# Patient Record
Sex: Female | Born: 1975 | Race: White | Hispanic: No | Marital: Married | Smoking: Former smoker
Health system: Southern US, Community
[De-identification: ages and names within clinical notes are randomized; demographics above are authoritative.]

## PROBLEM LIST (undated history)

## (undated) DIAGNOSIS — R7303 Prediabetes: Secondary | ICD-10-CM

## (undated) DIAGNOSIS — E119 Type 2 diabetes mellitus without complications: Secondary | ICD-10-CM

## (undated) DIAGNOSIS — S0990XA Unspecified injury of head, initial encounter: Secondary | ICD-10-CM

## (undated) DIAGNOSIS — E221 Hyperprolactinemia: Secondary | ICD-10-CM

## (undated) DIAGNOSIS — D497 Neoplasm of unspecified behavior of endocrine glands and other parts of nervous system: Secondary | ICD-10-CM

## (undated) HISTORY — DX: Unspecified injury of head, initial encounter: S09.90XA

## (undated) HISTORY — DX: Hyperprolactinemia: E22.1

## (undated) HISTORY — DX: Prediabetes: R73.03

---

## 2001-05-26 ENCOUNTER — Other Ambulatory Visit: Admission: RE | Admit: 2001-05-26 | Discharge: 2001-05-26 | Payer: Self-pay | Admitting: Emergency Medicine

## 2002-07-06 ENCOUNTER — Other Ambulatory Visit: Admission: RE | Admit: 2002-07-06 | Discharge: 2002-07-06 | Payer: Self-pay | Admitting: Family Medicine

## 2003-09-13 ENCOUNTER — Other Ambulatory Visit: Admission: RE | Admit: 2003-09-13 | Discharge: 2003-09-13 | Payer: Self-pay | Admitting: Family Medicine

## 2004-09-25 ENCOUNTER — Other Ambulatory Visit: Admission: RE | Admit: 2004-09-25 | Discharge: 2004-09-25 | Payer: Self-pay | Admitting: Family Medicine

## 2005-10-17 ENCOUNTER — Other Ambulatory Visit: Admission: RE | Admit: 2005-10-17 | Discharge: 2005-10-17 | Payer: Self-pay | Admitting: Family Medicine

## 2006-02-04 ENCOUNTER — Other Ambulatory Visit: Admission: RE | Admit: 2006-02-04 | Discharge: 2006-02-04 | Payer: Self-pay | Admitting: Obstetrics and Gynecology

## 2006-08-06 ENCOUNTER — Inpatient Hospital Stay (HOSPITAL_COMMUNITY): Admission: AD | Admit: 2006-08-06 | Discharge: 2006-08-09 | Payer: Self-pay | Admitting: Obstetrics and Gynecology

## 2007-01-22 ENCOUNTER — Other Ambulatory Visit: Admission: RE | Admit: 2007-01-22 | Discharge: 2007-01-22 | Payer: Self-pay | Admitting: Obstetrics and Gynecology

## 2008-01-27 ENCOUNTER — Other Ambulatory Visit: Admission: RE | Admit: 2008-01-27 | Discharge: 2008-01-27 | Payer: Self-pay | Admitting: Obstetrics and Gynecology

## 2008-03-18 ENCOUNTER — Encounter: Admission: RE | Admit: 2008-03-18 | Discharge: 2008-03-18 | Payer: Self-pay | Admitting: Family Medicine

## 2008-03-29 ENCOUNTER — Encounter: Admission: RE | Admit: 2008-03-29 | Discharge: 2008-06-27 | Payer: Self-pay | Admitting: Family Medicine

## 2008-09-18 ENCOUNTER — Encounter: Admission: RE | Admit: 2008-09-18 | Discharge: 2008-09-18 | Payer: Self-pay | Admitting: Internal Medicine

## 2009-05-30 ENCOUNTER — Other Ambulatory Visit: Admission: RE | Admit: 2009-05-30 | Discharge: 2009-05-30 | Payer: Self-pay | Admitting: Obstetrics and Gynecology

## 2009-08-09 ENCOUNTER — Ambulatory Visit (HOSPITAL_COMMUNITY): Admission: RE | Admit: 2009-08-09 | Discharge: 2009-08-09 | Payer: Self-pay | Admitting: Obstetrics and Gynecology

## 2009-09-06 ENCOUNTER — Ambulatory Visit (HOSPITAL_COMMUNITY): Admission: RE | Admit: 2009-09-06 | Discharge: 2009-09-06 | Payer: Self-pay | Admitting: Obstetrics and Gynecology

## 2009-11-29 ENCOUNTER — Encounter (INDEPENDENT_AMBULATORY_CARE_PROVIDER_SITE_OTHER): Payer: Self-pay | Admitting: Obstetrics and Gynecology

## 2009-11-29 ENCOUNTER — Inpatient Hospital Stay (HOSPITAL_COMMUNITY): Admission: AD | Admit: 2009-11-29 | Discharge: 2009-12-01 | Payer: Self-pay | Admitting: Obstetrics and Gynecology

## 2010-04-13 ENCOUNTER — Encounter: Admission: RE | Admit: 2010-04-13 | Discharge: 2010-04-13 | Payer: Self-pay | Admitting: Internal Medicine

## 2010-05-22 ENCOUNTER — Other Ambulatory Visit: Admission: RE | Admit: 2010-05-22 | Discharge: 2010-05-22 | Payer: Self-pay | Admitting: Obstetrics and Gynecology

## 2010-05-24 ENCOUNTER — Encounter: Admission: RE | Admit: 2010-05-24 | Discharge: 2010-05-24 | Payer: Self-pay | Admitting: Obstetrics and Gynecology

## 2010-12-02 ENCOUNTER — Encounter: Payer: Self-pay | Admitting: Internal Medicine

## 2010-12-03 ENCOUNTER — Encounter: Payer: Self-pay | Admitting: Obstetrics and Gynecology

## 2010-12-03 ENCOUNTER — Encounter: Payer: Self-pay | Admitting: Internal Medicine

## 2011-01-27 LAB — URINALYSIS, ROUTINE W REFLEX MICROSCOPIC
Bilirubin Urine: NEGATIVE
Ketones, ur: NEGATIVE mg/dL
Protein, ur: NEGATIVE mg/dL
pH: 7.5 (ref 5.0–8.0)

## 2011-01-27 LAB — CBC
HCT: 29.7 % — ABNORMAL LOW (ref 36.0–46.0)
HCT: 36.3 % (ref 36.0–46.0)
Hemoglobin: 9.7 g/dL — ABNORMAL LOW (ref 12.0–15.0)
MCV: 82.3 fL (ref 78.0–100.0)
Platelets: 168 10*3/uL (ref 150–400)
RBC: 4.41 MIL/uL (ref 3.87–5.11)

## 2011-03-29 NOTE — Discharge Summary (Signed)
Cynthia [name], Cynthia [name]           ACCOUNT NO.:  [id number]   MEDICAL RECORD NO.:  [id number]          PATIENT TYPE:  INP   LOCATION:  9125                          FACILITY:  [facility]   PHYSICIAN:  Gerald Leitz, MD          DATE OF BIRTH:  23-Oct-1976   DATE OF ADMISSION:  08/06/2006  DATE OF DISCHARGE:  08/09/2006                                 DISCHARGE SUMMARY   INDICATION FOR ADMISSION:  Term intrauterine pregnancy, with history of  cesarean section, in labor.   DISCHARGE DIAGNOSIS:  Status post repeat cesarean section.   BRIEF HOSPITALIZATION COURSE:  Patient admitted on August 06, 2006 with  onset of labor.  Had a history of cesarean section and desired repeat.  Cesarean section was performed without complication.  The patient tolerated  the operation well.  Had a post-cesarean section hematocrit of 9.4.  She was  started on iron sulfate for anemia.  She was discharged home on August 09, 2006, doing well and in stable condition.   DISCHARGE MEDICATIONS:  1. Percocet.  2. Motrin.   She will follow up with Dr. Richardson Dopp in 10-14 days.   ACTIVITY:  Ad lib.  Pelvic rest x6 weeks.  No driving for 2 weeks.      Gerald Leitz, MD  Electronically Signed     TC/MEDQ  D:  09/05/2006  T:  09/08/2006  Job:  [id number]

## 2011-03-29 NOTE — Op Note (Signed)
NAMELAURIN, Cynthia           ACCOUNT NO.:  [id number]   MEDICAL RECORD NO.:  [id number]          PATIENT TYPE:  INP   LOCATION:  9125                          [facility]   PHYSICIAN:  Gerald Leitz, MD          DATE OF BIRTH:  1976-01-19   DATE OF PROCEDURE:  08/06/2006  DATE OF DISCHARGE:                                 OPERATIVE REPORT   PREOPERATIVE DIAGNOSIS:  A 37-2/7 week intrauterine pregnancy with history  of cesarean section and labor.  Desires repeat cesarean.   POSTOPERATIVE DIAGNOSIS:  A 37-2/7 week intrauterine pregnancy with history  of cesarean section and labor.  Desires repeat cesarean.   PROCEDURE:  Repeat low transverse cesarean section.   SURGEON:  Gerald Leitz, MD   ASSISTANT:  Bing Neighbors. Delcambre, MD   ANESTHESIA:  Spinal.   SPECIMENS:  Placenta specimen sent to pathology.   ESTIMATED BLOOD LOSS:  800 cc.   FLUIDS:  Lactated Ringer's 3500 cc.   URINE OUTPUT:  Clear urine, 200 cc, at the end of the procedure.   COMPLICATIONS:  None.   INDICATIONS:  This is a 35 year old G2, P1, 0-0-1, at 37-2/7 weeks  intrauterine pregnancy with history of cesarean section, who presented to  the maternity admissions unit in labor.  Maximum dilatation of 4 cm.  She  desires repeat cesarean section.  Risks, benefits and alternatives were  discussed with the patient.  She was taken to the operating room.   PROCEDURE:  She was prepped and draped in the usual sterile fashion.  A  Pfannenstiel skin incision was made with the scalpel and carried down to the  underlying layer of fascia.  The fascia was incised in the midline.  The  incision was extended laterally with Mayo scissors.  The anterior aspect of  the fascia was elevated, and the underlying rectus muscles were dissected  off using Mayo scissors.  This was repeated on the anterior aspect of the  fascial incision.  The rectus muscles were separated in the midline.  The  peritoneum was identified, tented up, and  entered sharply with Metzenbaum  scissors.  The incision was extended superiorly and inferiorly with  visualization of the bladder.  A bladder blade was then inserted.  The  vesicouterine peritoneum was identified, tented up, entered sharply with  Metzenbaum scissors.  The incision was extended laterally with Metzenbaum  scissors.  The bladder flap was created digitally.  The bladder blade was  then reinserted.  A low transverse incision was made with the scalpel.  The  incision was extended laterally with bandage scissors and razor ruptured.  The infant's head was delivered atraumatically.  Nuchal cord x1 was reduced.  The mouth and nose were bulb-suctioned. The anterior shoulder and body were  delivered.  The cord was clamped x2.  The infant was then handed off to the  waiting neonatologist.  The placenta was removed manually.  The uterus  exteriorized, cleared of all clots and debris.  The uterine incision was  repaired with 0 Vicryl in a running locked fashion.  A second layer of the  same suture was used with excellent hemostasis noted.  The uterus was  returned to the abdomen.  The abdomen was then copiously irrigated.  The  peritoneum was reapproximated with 2-0 Vicryl.  The fascia was  reapproximated with 0 PDS.  The skin was reapproximated with oral Vicodin  with a Mellody Dance needle.  Sponge, lap, and needle counts were correct x2.  Ancef  2 gm were given at cord clamp.  The patient was taken to the recovery room  awake in a stable condition.      Gerald Leitz, MD  Electronically Signed     TC/MEDQ  D:  08/06/2006  T:  08/08/2006  Job:  [id number]

## 2011-05-01 ENCOUNTER — Other Ambulatory Visit: Payer: Self-pay | Admitting: Internal Medicine

## 2011-05-01 DIAGNOSIS — D353 Benign neoplasm of craniopharyngeal duct: Secondary | ICD-10-CM

## 2011-05-28 ENCOUNTER — Other Ambulatory Visit (HOSPITAL_COMMUNITY)
Admission: RE | Admit: 2011-05-28 | Discharge: 2011-05-28 | Disposition: A | Discharge status: Home / Self Care | Payer: BC Managed Care – PPO | Source: Ambulatory Visit | Attending: Obstetrics and Gynecology | Admitting: Obstetrics and Gynecology

## 2011-05-28 ENCOUNTER — Other Ambulatory Visit: Payer: Self-pay | Admitting: Obstetrics and Gynecology

## 2011-05-28 DIAGNOSIS — Z01419 Encounter for gynecological examination (general) (routine) without abnormal findings: Secondary | ICD-10-CM | POA: Insufficient documentation

## 2011-06-15 ENCOUNTER — Ambulatory Visit
Admission: RE | Admit: 2011-06-15 | Discharge: 2011-06-15 | Disposition: A | Discharge status: Home / Self Care | Payer: BC Managed Care – PPO | Source: Ambulatory Visit | Attending: Internal Medicine | Admitting: Internal Medicine

## 2011-06-15 DIAGNOSIS — D353 Benign neoplasm of craniopharyngeal duct: Secondary | ICD-10-CM

## 2011-06-15 MED ORDER — GADOBENATE DIMEGLUMINE 529 MG/ML IV SOLN
11.0000 mL | Freq: Once | INTRAVENOUS | Status: AC | PRN
  Administered 2011-06-15: 11 mL via INTRAVENOUS

## 2012-06-01 ENCOUNTER — Other Ambulatory Visit (HOSPITAL_COMMUNITY)
Admission: RE | Admit: 2012-06-01 | Discharge: 2012-06-01 | Disposition: A | Discharge status: Home / Self Care | Payer: BC Managed Care – PPO | Source: Ambulatory Visit | Attending: Obstetrics and Gynecology | Admitting: Obstetrics and Gynecology

## 2012-06-01 ENCOUNTER — Other Ambulatory Visit: Payer: Self-pay | Admitting: Obstetrics and Gynecology

## 2012-06-01 DIAGNOSIS — Z01419 Encounter for gynecological examination (general) (routine) without abnormal findings: Secondary | ICD-10-CM | POA: Insufficient documentation

## 2013-06-16 ENCOUNTER — Other Ambulatory Visit: Payer: Self-pay | Admitting: Obstetrics and Gynecology

## 2013-06-16 ENCOUNTER — Other Ambulatory Visit (HOSPITAL_COMMUNITY)
Admission: RE | Admit: 2013-06-16 | Discharge: 2013-06-16 | Disposition: A | Discharge status: Home / Self Care | Payer: BC Managed Care – PPO | Source: Ambulatory Visit | Attending: Obstetrics and Gynecology | Admitting: Obstetrics and Gynecology

## 2013-06-16 DIAGNOSIS — Z1151 Encounter for screening for human papillomavirus (HPV): Secondary | ICD-10-CM | POA: Insufficient documentation

## 2013-06-16 DIAGNOSIS — Z01419 Encounter for gynecological examination (general) (routine) without abnormal findings: Secondary | ICD-10-CM | POA: Insufficient documentation

## 2014-06-06 ENCOUNTER — Other Ambulatory Visit: Payer: Self-pay | Admitting: Internal Medicine

## 2014-06-06 DIAGNOSIS — D352 Benign neoplasm of pituitary gland: Secondary | ICD-10-CM

## 2014-06-15 ENCOUNTER — Ambulatory Visit
Admission: RE | Admit: 2014-06-15 | Discharge: 2014-06-15 | Disposition: A | Discharge status: Home / Self Care | Payer: BC Managed Care – PPO | Source: Ambulatory Visit | Attending: Internal Medicine | Admitting: Internal Medicine

## 2014-06-15 DIAGNOSIS — D352 Benign neoplasm of pituitary gland: Secondary | ICD-10-CM

## 2014-06-15 MED ORDER — GADOBENATE DIMEGLUMINE 529 MG/ML IV SOLN
10.0000 mL | Freq: Once | INTRAVENOUS | Status: AC | PRN
  Administered 2014-06-15: 10 mL via INTRAVENOUS

## 2014-06-20 ENCOUNTER — Other Ambulatory Visit (HOSPITAL_COMMUNITY)
Admission: RE | Admit: 2014-06-20 | Discharge: 2014-06-20 | Disposition: A | Discharge status: Home / Self Care | Payer: BC Managed Care – PPO | Source: Ambulatory Visit | Attending: Obstetrics and Gynecology | Admitting: Obstetrics and Gynecology

## 2014-06-20 ENCOUNTER — Other Ambulatory Visit: Payer: Self-pay | Admitting: Obstetrics and Gynecology

## 2014-06-20 DIAGNOSIS — Z01419 Encounter for gynecological examination (general) (routine) without abnormal findings: Secondary | ICD-10-CM | POA: Diagnosis not present

## 2014-06-21 LAB — CYTOLOGY - PAP

## 2015-07-12 ENCOUNTER — Other Ambulatory Visit: Payer: Self-pay | Admitting: Obstetrics and Gynecology

## 2015-07-12 ENCOUNTER — Other Ambulatory Visit (HOSPITAL_COMMUNITY)
Admission: RE | Admit: 2015-07-12 | Discharge: 2015-07-12 | Disposition: A | Discharge status: Home / Self Care | Payer: BLUE CROSS/BLUE SHIELD | Source: Ambulatory Visit | Attending: Obstetrics and Gynecology | Admitting: Obstetrics and Gynecology

## 2015-07-12 DIAGNOSIS — Z01419 Encounter for gynecological examination (general) (routine) without abnormal findings: Secondary | ICD-10-CM | POA: Diagnosis not present

## 2015-07-13 LAB — CYTOLOGY - PAP

## 2015-07-20 ENCOUNTER — Other Ambulatory Visit: Payer: Self-pay | Admitting: Family Medicine

## 2015-07-20 DIAGNOSIS — R1011 Right upper quadrant pain: Secondary | ICD-10-CM

## 2015-07-24 ENCOUNTER — Other Ambulatory Visit: Payer: BLUE CROSS/BLUE SHIELD

## 2015-07-26 ENCOUNTER — Ambulatory Visit
Admission: RE | Admit: 2015-07-26 | Discharge: 2015-07-26 | Disposition: A | Discharge status: Home / Self Care | Payer: BLUE CROSS/BLUE SHIELD | Source: Ambulatory Visit | Attending: Family Medicine | Admitting: Family Medicine

## 2015-07-26 DIAGNOSIS — R1011 Right upper quadrant pain: Secondary | ICD-10-CM

## 2016-06-28 ENCOUNTER — Other Ambulatory Visit: Payer: Self-pay | Admitting: Internal Medicine

## 2016-06-28 DIAGNOSIS — D352 Benign neoplasm of pituitary gland: Secondary | ICD-10-CM

## 2016-07-05 ENCOUNTER — Other Ambulatory Visit: Payer: BLUE CROSS/BLUE SHIELD

## 2016-07-12 ENCOUNTER — Other Ambulatory Visit: Payer: Self-pay | Admitting: Obstetrics and Gynecology

## 2016-07-12 ENCOUNTER — Other Ambulatory Visit (HOSPITAL_COMMUNITY)
Admission: RE | Admit: 2016-07-12 | Discharge: 2016-07-12 | Disposition: A | Discharge status: Home / Self Care | Payer: BLUE CROSS/BLUE SHIELD | Source: Ambulatory Visit | Attending: Obstetrics and Gynecology | Admitting: Obstetrics and Gynecology

## 2016-07-12 DIAGNOSIS — Z1151 Encounter for screening for human papillomavirus (HPV): Secondary | ICD-10-CM | POA: Diagnosis present

## 2016-07-12 DIAGNOSIS — Z01419 Encounter for gynecological examination (general) (routine) without abnormal findings: Secondary | ICD-10-CM | POA: Diagnosis present

## 2016-07-13 ENCOUNTER — Other Ambulatory Visit: Payer: BLUE CROSS/BLUE SHIELD

## 2016-07-16 LAB — CYTOLOGY - PAP

## 2016-07-20 ENCOUNTER — Ambulatory Visit
Admission: RE | Admit: 2016-07-20 | Discharge: 2016-07-20 | Disposition: A | Discharge status: Home / Self Care | Payer: BLUE CROSS/BLUE SHIELD | Source: Ambulatory Visit | Attending: Internal Medicine | Admitting: Internal Medicine

## 2016-07-20 DIAGNOSIS — D352 Benign neoplasm of pituitary gland: Secondary | ICD-10-CM

## 2016-07-20 MED ORDER — GADOBENATE DIMEGLUMINE 529 MG/ML IV SOLN
10.0000 mL | Freq: Once | INTRAVENOUS | Status: AC | PRN
  Administered 2016-07-20: 10 mL via INTRAVENOUS

## 2017-04-16 ENCOUNTER — Other Ambulatory Visit: Payer: Self-pay | Admitting: Family Medicine

## 2017-04-16 DIAGNOSIS — N644 Mastodynia: Secondary | ICD-10-CM

## 2017-04-16 DIAGNOSIS — N63 Unspecified lump in unspecified breast: Secondary | ICD-10-CM

## 2017-04-18 ENCOUNTER — Ambulatory Visit
Admission: RE | Admit: 2017-04-18 | Discharge: 2017-04-18 | Disposition: A | Discharge status: Home / Self Care | Payer: BLUE CROSS/BLUE SHIELD | Source: Ambulatory Visit | Attending: Family Medicine | Admitting: Family Medicine

## 2017-04-18 DIAGNOSIS — N63 Unspecified lump in unspecified breast: Secondary | ICD-10-CM

## 2017-04-18 DIAGNOSIS — N644 Mastodynia: Secondary | ICD-10-CM

## 2018-02-03 ENCOUNTER — Other Ambulatory Visit: Payer: Self-pay

## 2018-02-03 ENCOUNTER — Emergency Department (HOSPITAL_COMMUNITY): Payer: BLUE CROSS/BLUE SHIELD

## 2018-02-03 ENCOUNTER — Encounter (HOSPITAL_COMMUNITY): Payer: Self-pay

## 2018-02-03 ENCOUNTER — Emergency Department (HOSPITAL_COMMUNITY)
Admission: EM | Admit: 2018-02-03 | Discharge: 2018-02-03 | Disposition: A | Discharge status: Home / Self Care | Payer: BLUE CROSS/BLUE SHIELD | Attending: Emergency Medicine | Admitting: Emergency Medicine

## 2018-02-03 DIAGNOSIS — R000 Tachycardia, unspecified: Secondary | ICD-10-CM | POA: Insufficient documentation

## 2018-02-03 DIAGNOSIS — R202 Paresthesia of skin: Secondary | ICD-10-CM | POA: Diagnosis present

## 2018-02-03 DIAGNOSIS — R9431 Abnormal electrocardiogram [ECG] [EKG]: Secondary | ICD-10-CM | POA: Diagnosis not present

## 2018-02-03 DIAGNOSIS — R05 Cough: Secondary | ICD-10-CM | POA: Insufficient documentation

## 2018-02-03 DIAGNOSIS — R200 Anesthesia of skin: Secondary | ICD-10-CM

## 2018-02-03 DIAGNOSIS — E119 Type 2 diabetes mellitus without complications: Secondary | ICD-10-CM | POA: Diagnosis not present

## 2018-02-03 HISTORY — DX: Type 2 diabetes mellitus without complications: E11.9

## 2018-02-03 HISTORY — DX: Neoplasm of unspecified behavior of endocrine glands and other parts of nervous system: D49.7

## 2018-02-03 LAB — D-DIMER, QUANTITATIVE (NOT AT ARMC): D DIMER QUANT: 0.33 ug{FEU}/mL (ref 0.00–0.50)

## 2018-02-03 LAB — CBC WITH DIFFERENTIAL/PLATELET
BASOS ABS: 0 10*3/uL (ref 0.0–0.1)
Basophils Relative: 0 %
EOS ABS: 0.1 10*3/uL (ref 0.0–0.7)
EOS PCT: 1 %
HCT: 38 % (ref 36.0–46.0)
Hemoglobin: 12.3 g/dL (ref 12.0–15.0)
LYMPHS ABS: 2 10*3/uL (ref 0.7–4.0)
Lymphocytes Relative: 15 %
MCH: 28.8 pg (ref 26.0–34.0)
MCHC: 32.4 g/dL (ref 30.0–36.0)
MCV: 89 fL (ref 78.0–100.0)
Monocytes Absolute: 0.7 10*3/uL (ref 0.1–1.0)
Monocytes Relative: 5 %
Neutro Abs: 10.8 10*3/uL — ABNORMAL HIGH (ref 1.7–7.7)
Neutrophils Relative %: 79 %
PLATELETS: 248 10*3/uL (ref 150–400)
RBC: 4.27 MIL/uL (ref 3.87–5.11)
RDW: 14.4 % (ref 11.5–15.5)
WBC: 13.7 10*3/uL — AB (ref 4.0–10.5)

## 2018-02-03 LAB — BASIC METABOLIC PANEL
Anion gap: 8 (ref 5–15)
BUN: 13 mg/dL (ref 6–20)
CO2: 19 mmol/L — ABNORMAL LOW (ref 22–32)
CREATININE: 0.61 mg/dL (ref 0.44–1.00)
Calcium: 8.1 mg/dL — ABNORMAL LOW (ref 8.9–10.3)
Chloride: 111 mmol/L (ref 101–111)
GFR calc Af Amer: >60 mL/min (ref >60)
Glucose, Bld: 107 mg/dL — ABNORMAL HIGH (ref 65–99)
Potassium: 4 mmol/L (ref 3.5–5.1)
Sodium: 138 mmol/L (ref 135–145)

## 2018-02-03 LAB — I-STAT BETA HCG BLOOD, ED (MC, WL, AP ONLY): I-stat hCG, quantitative: <5 m[IU]/mL (ref <5)

## 2018-02-03 MED ORDER — ONDANSETRON HCL 4 MG/2ML IJ SOLN
4.0000 mg | Freq: Once | INTRAMUSCULAR | Status: AC
  Administered 2018-02-03: 4 mg via INTRAVENOUS
  Filled 2018-02-03: qty 2

## 2018-02-03 MED ORDER — LORAZEPAM 2 MG/ML IJ SOLN
0.5000 mg | Freq: Once | INTRAMUSCULAR | Status: AC
  Administered 2018-02-03: 0.5 mg via INTRAVENOUS
  Filled 2018-02-03: qty 1

## 2018-02-03 NOTE — ED Provider Notes (Signed)
[facility] EMERGENCY DEPARTMENT Provider Note   CSN: [id number] Arrival date & time: 02/03/18  1837     History   Chief Complaint Chief Complaint  Patient presents with  . Shortness of Breath    HPI Cynthia [name] is a 42 y.o. female.  Who presents the emergency department via EMS from the urgent care for facial and hand paresthesia.  Patient states that she was at work when she felt a rhonchi in her left lower lung.  She coughed and tried to cough it out but then afterward felt onset of numbness around her mouth and bilateral hands.  She states that it came on and off over the next 2 hours while she was at work.  After work she went to [place] urgent care and was immediately sent to the emergency department for evaluation.  She has had no repeat sensation of paresthesia.  She denies hyperventilation or history of panic attack or significant straight stress.  She does appear somewhat tachypneic here and is tachycardic.  She has a prior past medical history of a prolactinoma however this has been followed and is resolved at this time.  Patient history states that she is diabetic but states that she is only prediabetic  HPI  Past Medical History:  Diagnosis Date  . Diabetes mellitus without complication ([place])   . Pituitary tumor    is now resolved    There are no active problems to display for this patient.   Past Surgical History:  Procedure Laterality Date  . CESAREAN SECTION       OB History   None      Home Medications    Prior to Admission medications   Not on File    Family History No family history on file.  Social History Social History   Tobacco Use  . Smoking status: Not on file  Substance Use Topics  . Alcohol use: Yes    Comment: "very rarely"  . Drug use: Never     Allergies   Penicillins   Review of Systems Review of Systems  Ten systems reviewed and are negative for acute change, except as noted in the HPI.    Physical Exam Updated Vital Signs BP 128/85   Pulse (!) 111   Temp 98.6 F (37 C) (Oral)   Resp 15   Ht 5\' 5"  (1.651 m)   Wt 78.9 kg (174 lb)   LMP 01/13/2018 (Within Weeks)   SpO2 98%   BMI 28.96 kg/m   Physical Exam  Constitutional: She is oriented to person, place, and time. She appears well-developed and well-nourished. No distress.  HENT:  Head: Normocephalic and atraumatic.  Eyes: Conjunctivae are normal. No scleral icterus.  Neck: Normal range of motion.  Cardiovascular: Normal rate, regular rhythm and normal heart sounds. Exam reveals no gallop and no friction rub.  No murmur heard. Pulmonary/Chest: Effort normal and breath sounds normal. No respiratory distress.  Abdominal: Soft. Bowel sounds are normal. She exhibits no distension and no mass. There is no tenderness. There is no guarding.  Neurological: She is alert and oriented to person, place, and time.  Skin: Skin is warm and dry. She is not diaphoretic.  Psychiatric: Her behavior is normal.  Nursing note and vitals reviewed.    ED Treatments / Results  Labs (all labs ordered are listed, but only abnormal results are displayed) Labs Reviewed  BASIC METABOLIC PANEL - Abnormal; Notable for the following components:  Result Value   CO2 19 (*)    Glucose, Bld 107 (*)    Calcium 8.1 (*)    All other components within normal limits  CBC WITH DIFFERENTIAL/PLATELET - Abnormal; Notable for the following components:   WBC 13.7 (*)    Neutro Abs 10.8 (*)    All other components within normal limits  D-DIMER, QUANTITATIVE (NOT AT [facility])  I-STAT BETA HCG BLOOD, ED ([facility], [facility], [facility] ONLY)    EKG EKG Interpretation  Date/Time:  Tuesday February 03 2018 18:40:12 EDT Ventricular Rate:  107 PR Interval:    QRS Duration: 100 QT Interval:  333 QTC Calculation: 445 R Axis:   -22 Text Interpretation:  Sinus tachycardia Borderline left axis deviation Borderline abnrm T, anterolateral leads Confirmed by Davonna Belling  [phone]) on 02/03/2018 8:54:33 PM   Radiology Dg Chest 2 View  Result Date: 02/03/2018 CLINICAL DATA:  Initial evaluation for acute shortness of breath. EXAM: CHEST - 2 VIEW COMPARISON:  None. FINDINGS: The cardiac and mediastinal silhouettes are stable in size and contour, and remain within normal limits. The lungs are normally inflated. No airspace consolidation, pleural effusion, or pulmonary edema is identified. There is no pneumothorax. No acute osseous abnormality identified. IMPRESSION: No active cardiopulmonary disease. Electronically Signed   By: Jeannine Boga M.D.   On: 02/03/2018 20:25    Procedures Procedures (including critical care time)  Medications Ordered in ED Medications - No data to display   Initial Impression / Assessment and Plan / ED Course  I have reviewed the triage vital signs and the nursing notes.  Pertinent labs & imaging results that were available during my care of the patient were reviewed by me and considered in my medical decision making (see chart for details).     Patient EKG abnormal but without significant emergent abnormalities or signs of ischemia, normal segments.  Patient persistently tachycardic and seems to be tachypneic which may be the underlying cause of her perioral and bilateral carpal paresthesia.  She has no other neurologic abnormalities or complaints.  Patient advised to follow closely with her PCP for repeat EKG and thyroid panel given her history of prolactinoma.  She appears otherwise safe for discharge at this time without any significant lab abnormalities.  Her d-dimer is negative and there is no evidence of pulmonary embolus Final Clinical Impressions(s) / ED Diagnoses   Final diagnoses:  Perioral numbness  Paresthesia of both hands  Tachycardia  Abnormal EKG    ED Discharge Orders    None       Ned Grace 02/03/18 2124    Davonna Belling, MD 02/03/18 2207

## 2018-02-03 NOTE — ED Notes (Signed)
ED Provider at bedside. 

## 2018-02-03 NOTE — ED Triage Notes (Signed)
Pt brought in by [facility] from [facility] for SOB and nausea. Pt states she went to urgent care for SOB, face and hand tingling x3 hours. Pt has no current c/o at this time. Pt denies dizziness, vomiting, chest pain. Pt denies any tingling ot SOB at this time.

## 2018-02-03 NOTE — Discharge Instructions (Signed)
Your work up in the ER shows no emergent cause of your symptoms today. You did have an abnormal ekg,but it does not appear concerning for any emergent heart issues. You should follow closely with your primary cared doctor for a thyroid panel.  Get help right away if: You feel weak. You have trouble walking or moving. You have problems with speech, understanding, or vision. You feel confused. You cannot control your bladder or bowel movements. You have numbness after an injury. You faint.

## 2018-02-23 ENCOUNTER — Encounter: Payer: Self-pay | Admitting: Cardiology

## 2018-02-23 ENCOUNTER — Other Ambulatory Visit: Payer: Self-pay

## 2018-02-24 ENCOUNTER — Ambulatory Visit: Payer: BLUE CROSS/BLUE SHIELD | Admitting: Cardiology

## 2018-03-03 ENCOUNTER — Ambulatory Visit (INDEPENDENT_AMBULATORY_CARE_PROVIDER_SITE_OTHER): Payer: BLUE CROSS/BLUE SHIELD | Admitting: Cardiology

## 2018-03-03 ENCOUNTER — Encounter: Payer: Self-pay | Admitting: *Deleted

## 2018-03-03 VITALS — BP 132/70 | HR 96 | Ht 65.0 in | Wt 176.8 lb

## 2018-03-03 DIAGNOSIS — E119 Type 2 diabetes mellitus without complications: Secondary | ICD-10-CM | POA: Diagnosis not present

## 2018-03-03 DIAGNOSIS — D497 Neoplasm of unspecified behavior of endocrine glands and other parts of nervous system: Secondary | ICD-10-CM | POA: Insufficient documentation

## 2018-03-03 DIAGNOSIS — S0990XA Unspecified injury of head, initial encounter: Secondary | ICD-10-CM | POA: Insufficient documentation

## 2018-03-03 DIAGNOSIS — E221 Hyperprolactinemia: Secondary | ICD-10-CM | POA: Insufficient documentation

## 2018-03-03 DIAGNOSIS — R002 Palpitations: Secondary | ICD-10-CM

## 2018-03-03 HISTORY — DX: Palpitations: R00.2

## 2018-03-03 NOTE — Progress Notes (Signed)
Cardiology Office Note:    Date:  03/03/2018   ID:  Cynthia [name], DOB 06/05/1976, MRN [id number]  PCP:  Maurice Small, MD  Cardiologist:  Jenean Lindau, MD   Referring MD: Christophe Louis, MD    ASSESSMENT:    1. Diabetes mellitus without complication ([place])   2. Palpitations    PLAN:    In order of problems listed above:  1. In view of the above primary prevention stressed.  Importance of compliance with diet and medications stressed.  Importance of exercise stressed in diet was stressed specifically with diabetes in mind.  I discussed with her about lipid check and she is agreeable to we will do some fasting blood work in the next few days. 2. Her blood pressure is stable.  She will be considered for ACE inhibitors or ARB therapy in view of her diabetes mellitus and I will leave this to her primary care physician. 3. Echocardiogram will be done to assess murmur heard on auscultation 4. We will obtain a one-month monitor the patient to assess her palpitations. 5. Patient will be seen in follow-up appointment in 6 months or earlier if the patient has any concerns 6. Advised her to be aggressive with diet and exercise and lose weight.   Medication Adjustments/Labs and Tests Ordered: Current medicines are reviewed at length with the patient today.  Concerns regarding medicines are outlined above.  No orders of the defined types were placed in this encounter.  No orders of the defined types were placed in this encounter.    History of Present Illness:    Cynthia [name] is a 42 y.o. female who is being seen today for the evaluation of palpitations at the request of Christophe Louis, MD.  Patient is a pleasant 42 year old female.  She has past medical history of prediabetes.  She mentions to me that she has been feeling palpitations over the past several months and they have gotten worse.  No orthopnea PND.  No history of dizziness or any syncope.  She is an active lady and she works at [employer].   She leads a sedentary lifestyle and does not exercise on a regular basis.  At the time of my evaluation, the patient is alert awake oriented and in no distress.  Past Medical History:  Diagnosis Date  . Diabetes mellitus without complication ([place])   . Head trauma   . Hyperprolactinemia ([place])   . Pituitary tumor    is now resolved  . Prediabetes     Past Surgical History:  Procedure Laterality Date  . CESAREAN SECTION      Current Medications: Current Meds  Medication Sig  . AVIANE 0.1-20 MG-MCG tablet   . fluticasone (FLONASE) 50 MCG/ACT nasal spray Place 1 spray into both nostrils daily.  . meclizine (ANTIVERT) 25 MG tablet Take 25 mg by mouth 2 (two) times daily as needed.  . metFORMIN (GLUCOPHAGE-XR) 500 MG 24 hr tablet   . ranitidine (ZANTAC) 150 MG tablet Take 150 mg by mouth as needed.     Allergies:   Penicillins   Social History   Socioeconomic History  . Marital status: Married    Spouse name: Not on file  . Number of children: Not on file  . Years of education: Not on file  . Highest education level: Not on file  Occupational History  . Not on file  Social Needs  . Financial resource strain: Not on file  . Food insecurity:    Worry:  Not on file    Inability: Not on file  . Transportation needs:    Medical: Not on file    Non-medical: Not on file  Tobacco Use  . Smoking status: Former Research scientist (life sciences)  . Smokeless tobacco: Never Used  Substance and Sexual Activity  . Alcohol use: Yes    Comment: "very rarely"  . Drug use: Never  . Sexual activity: Not on file  Lifestyle  . Physical activity:    Days per week: Not on file    Minutes per session: Not on file  . Stress: Not on file  Relationships  . Social connections:    Talks on phone: Not on file    Gets together: Not on file    Attends religious service: Not on file    Active member of club or organization: Not on file    Attends meetings of clubs or organizations: Not on file    Relationship status:  Not on file  Other Topics Concern  . Not on file  Social History Narrative  . Not on file     Family History: The patient's family history is not on file.  ROS:   Please see the history of present illness.    All other systems reviewed and are negative.  EKGs/Labs/Other Studies Reviewed:    The following studies were reviewed today: I discussed my findings with the patient at extensive length.  EKG has revealed sinus tachycardia.   Recent Labs: 02/03/2018: BUN 13; Creatinine, Ser 0.61; Hemoglobin 12.3; Platelets 248; Potassium 4.0; Sodium 138  Recent Lipid Panel No results found for: CHOL, TRIG, HDL, CHOLHDL, VLDL, LDLCALC, LDLDIRECT  Physical Exam:    VS:  BP 132/70 (BP Location: Right Arm, Patient Position: Sitting, Cuff Size: Normal)   Pulse 96   Ht 5\' 5"  (1.651 m)   Wt 176 lb 12.8 oz (80.2 kg)   SpO2 98%   BMI 29.42 kg/m     Wt Readings from Last 3 Encounters:  03/03/18 176 lb 12.8 oz (80.2 kg)  02/03/18 174 lb (78.9 kg)     GEN: Patient is in no acute distress HEENT: Normal NECK: No JVD; No carotid bruits LYMPHATICS: No lymphadenopathy CARDIAC: S1 S2 regular, 2/6 systolic murmur at the apex. RESPIRATORY:  Clear to auscultation without rales, wheezing or rhonchi  ABDOMEN: Soft, non-tender, non-distended MUSCULOSKELETAL:  No edema; No deformity  SKIN: Warm and dry NEUROLOGIC:  Alert and oriented x 3 PSYCHIATRIC:  Normal affect    Signed, Jenean Lindau, MD  03/03/2018 10:48 AM    [place]

## 2018-03-03 NOTE — Addendum Note (Signed)
Addended by: Aleatha Borer on: 03/03/2018 11:05 AM   Modules accepted: Orders

## 2018-03-03 NOTE — Addendum Note (Signed)
Addended by: Aleatha Borer on: 03/03/2018 11:15 AM   Modules accepted: Orders

## 2018-03-03 NOTE — Patient Instructions (Signed)
Medication Instructions:  Your physician recommends that you continue on your current medications as directed. Please refer to the Current Medication list given to you today.  Labwork: Your physician recommends that you return for lab work: When you get your echo/ event monitor so you can fast, BMP, CBC, TSH, Liver panel, Lipid.  Testing/Procedures: Your physician has requested that you have an echocardiogram. Echocardiography is a painless test that uses sound waves to create images of your heart. It provides your doctor with information about the size and shape of your heart and how well your heart's chambers and valves are working. This procedure takes approximately one hour. There are no restrictions for this procedure.  Your physician has recommended that you wear an event monitor. Event monitors are medical devices that record the heart's electrical activity. Doctors most often [place] these monitors to diagnose arrhythmias. Arrhythmias are problems with the speed or rhythm of the heartbeat. The monitor is a small, portable device. You can wear one while you do your normal daily activities. This is usually used to diagnose what is causing palpitations/syncope (passing out).  Follow-Up: Your physician recommends that you schedule a follow-up appointment in: 6 months with Dr. Geraldo Pitter   Any Other Special Instructions Will Be Listed Below (If Applicable).     If you need a refill on your cardiac medications before your next appointment, please call your pharmacy.

## 2018-03-27 ENCOUNTER — Ambulatory Visit (HOSPITAL_BASED_OUTPATIENT_CLINIC_OR_DEPARTMENT_OTHER)
Admission: RE | Admit: 2018-03-27 | Discharge: 2018-03-27 | Disposition: A | Discharge status: Home / Self Care | Payer: BLUE CROSS/BLUE SHIELD | Source: Ambulatory Visit | Attending: Cardiology | Admitting: Cardiology

## 2018-03-27 ENCOUNTER — Ambulatory Visit: Payer: BLUE CROSS/BLUE SHIELD

## 2018-03-27 DIAGNOSIS — R002 Palpitations: Secondary | ICD-10-CM

## 2018-03-27 DIAGNOSIS — I081 Rheumatic disorders of both mitral and tricuspid valves: Secondary | ICD-10-CM | POA: Diagnosis not present

## 2018-03-27 DIAGNOSIS — E119 Type 2 diabetes mellitus without complications: Secondary | ICD-10-CM | POA: Diagnosis not present

## 2018-03-27 NOTE — Progress Notes (Signed)
  Echocardiogram 2D Echocardiogram has been performed.  Joelene Millin 03/27/2018, 9:18 AM

## 2018-03-28 LAB — BASIC METABOLIC PANEL WITH GFR
BUN/Creatinine Ratio: 17 (ref 9–23)
BUN: 12 mg/dL (ref 6–24)
CO2: 21 mmol/L (ref 20–29)
Calcium: 9.1 mg/dL (ref 8.7–10.2)
Chloride: 104 mmol/L (ref 96–106)
Creatinine, Ser: 0.72 mg/dL (ref 0.57–1.00)
GFR calc Af Amer: 119 mL/min/1.73
GFR calc non Af Amer: 103 mL/min/1.73
Glucose: 79 mg/dL (ref 65–99)
Potassium: 4.1 mmol/L (ref 3.5–5.2)
Sodium: 139 mmol/L (ref 134–144)

## 2018-03-28 LAB — LIPID PANEL
Chol/HDL Ratio: 3.3 ratio (ref 0.0–4.4)
Cholesterol, Total: 151 mg/dL (ref 100–199)
HDL: 46 mg/dL
LDL Calculated: 81 mg/dL (ref 0–99)
Triglycerides: 120 mg/dL (ref 0–149)
VLDL Cholesterol Cal: 24 mg/dL (ref 5–40)

## 2018-03-28 LAB — CBC
HEMATOCRIT: 38.7 % (ref 34.0–46.6)
HEMOGLOBIN: 12.8 g/dL (ref 11.1–15.9)
MCH: 29.3 pg (ref 26.6–33.0)
MCHC: 33.1 g/dL (ref 31.5–35.7)
MCV: 89 fL (ref 79–97)
PLATELETS: 230 10*3/uL (ref 150–379)
RBC: 4.37 x10E6/uL (ref 3.77–5.28)
RDW: 14.3 % (ref 12.3–15.4)
WBC: 8.2 10*3/uL (ref 3.4–10.8)

## 2018-03-28 LAB — HEPATIC FUNCTION PANEL
ALT: 10 IU/L (ref 0–32)
AST: 14 IU/L (ref 0–40)
Albumin: 4.1 g/dL (ref 3.5–5.5)
Alkaline Phosphatase: 66 IU/L (ref 39–117)
Bilirubin Total: 0.7 mg/dL (ref 0.0–1.2)
Bilirubin, Direct: 0.16 mg/dL (ref 0.00–0.40)
Total Protein: 6.6 g/dL (ref 6.0–8.5)

## 2018-03-28 LAB — TSH: TSH: 2.54 u[IU]/mL (ref 0.450–4.500)

## 2018-03-31 ENCOUNTER — Telehealth: Payer: Self-pay

## 2018-03-31 NOTE — Telephone Encounter (Signed)
-----   Message from Jenean Lindau, MD sent at 03/31/2018 11:55 AM EDT ----- Please let her know about the prolapse and mild regurgitation but it is not something that will need any intervention in the near future and I will discuss this more at the next appointment. Jenean Lindau, MD 03/31/2018 11:55 AM

## 2018-03-31 NOTE — Telephone Encounter (Signed)
Left voicemail requesting that the patient call th office regarding her cardiac testing.

## 2018-04-01 ENCOUNTER — Other Ambulatory Visit: Payer: Self-pay

## 2018-04-01 DIAGNOSIS — R002 Palpitations: Secondary | ICD-10-CM

## 2018-04-01 DIAGNOSIS — I499 Cardiac arrhythmia, unspecified: Secondary | ICD-10-CM

## 2018-04-01 MED ORDER — METOPROLOL SUCCINATE 25 MG PO CS24: 25.0000 mg | EXTENDED_RELEASE_CAPSULE | Freq: Every day | ORAL | 0 refills | Status: DC

## 2018-04-01 NOTE — Progress Notes (Signed)
Informed patient that according to her monitor results she would start metoprolol succinate 25 mg q daily and have a stress echo completed. The patient was agreeable to the plan per Dr. Geraldo Pitter.

## 2018-04-13 ENCOUNTER — Telehealth: Payer: Self-pay | Admitting: Cardiology

## 2018-04-13 NOTE — Telephone Encounter (Signed)
Please call patient, script for beta blocker insurance will not cover, any other med that insurance covers?

## 2018-04-13 NOTE — Telephone Encounter (Signed)
I do not understand your request?

## 2018-04-14 ENCOUNTER — Other Ambulatory Visit: Payer: Self-pay

## 2018-04-14 MED ORDER — METOPROLOL SUCCINATE ER 25 MG PO TB24: 25.0000 mg | ORAL_TABLET | Freq: Every day | ORAL | 2 refills | Status: DC

## 2018-04-27 ENCOUNTER — Telehealth: Payer: Self-pay | Admitting: Cardiology

## 2018-04-27 NOTE — Telephone Encounter (Signed)
Wants to know if she needs pre med before dental work

## 2018-04-27 NOTE — Telephone Encounter (Signed)
Patient advised that no medication is needed prior to dental work per Dr. Bettina Gavia. Patient verbalized understanding. No further questions.

## 2018-04-29 ENCOUNTER — Ambulatory Visit (HOSPITAL_BASED_OUTPATIENT_CLINIC_OR_DEPARTMENT_OTHER)
Admission: RE | Admit: 2018-04-29 | Discharge: 2018-04-29 | Disposition: A | Discharge status: Home / Self Care | Payer: BLUE CROSS/BLUE SHIELD | Source: Ambulatory Visit | Attending: Cardiology | Admitting: Cardiology

## 2018-04-29 DIAGNOSIS — R002 Palpitations: Secondary | ICD-10-CM | POA: Insufficient documentation

## 2018-04-29 DIAGNOSIS — I499 Cardiac arrhythmia, unspecified: Secondary | ICD-10-CM | POA: Diagnosis not present

## 2018-04-29 NOTE — Progress Notes (Signed)
  Echocardiogram Echocardiogram Stress Test has been performed.  Joelene Millin 04/29/2018, 11:21 AM

## 2018-05-04 ENCOUNTER — Telehealth: Payer: Self-pay | Admitting: Cardiology

## 2018-05-04 NOTE — Telephone Encounter (Signed)
Patient states she needs to speak to you regarding her Metoprolol. She said someone called her Friday but I did not see any call.

## 2018-05-04 NOTE — Telephone Encounter (Signed)
Clarified with patient instructions per Dr. Geraldo Pitter for patient to increase her water intake.

## 2018-06-08 ENCOUNTER — Telehealth: Payer: Self-pay | Admitting: Cardiology

## 2018-06-08 MED ORDER — METOPROLOL SUCCINATE ER 25 MG PO TB24: 25.0000 mg | ORAL_TABLET | Freq: Every day | ORAL | 2 refills | Status: DC

## 2018-06-08 MED ORDER — METOPROLOL SUCCINATE ER 50 MG PO TB24: 50.0000 mg | ORAL_TABLET | Freq: Every day | ORAL | 2 refills | Status: DC

## 2018-06-08 NOTE — Telephone Encounter (Signed)
Clarified with patient that she is taking metoprolol succinate 50 mg daily not 25 mg twice daily. New and correct refill sent into pharmacy. No further questions.

## 2018-06-08 NOTE — Addendum Note (Signed)
Addended by: Austin Miles on: 06/08/2018 02:23 PM   Modules accepted: Orders

## 2018-06-08 NOTE — Telephone Encounter (Signed)
Patient is out of metoprolol. Physician increased doxage and new script needs to be sent to [facility] on [address] in [place]

## 2018-06-08 NOTE — Telephone Encounter (Signed)
Refill for metoprolol succinate sent to [facility] in [place].

## 2018-08-13 ENCOUNTER — Other Ambulatory Visit: Payer: Self-pay | Admitting: Family Medicine

## 2018-08-13 DIAGNOSIS — Z1231 Encounter for screening mammogram for malignant neoplasm of breast: Secondary | ICD-10-CM

## 2018-08-14 ENCOUNTER — Ambulatory Visit
Admission: RE | Admit: 2018-08-14 | Discharge: 2018-08-14 | Disposition: A | Discharge status: Home / Self Care | Payer: BLUE CROSS/BLUE SHIELD | Source: Ambulatory Visit | Attending: Family Medicine | Admitting: Family Medicine

## 2018-08-14 DIAGNOSIS — Z1231 Encounter for screening mammogram for malignant neoplasm of breast: Secondary | ICD-10-CM

## 2018-09-21 ENCOUNTER — Ambulatory Visit: Payer: BLUE CROSS/BLUE SHIELD | Admitting: Cardiology

## 2018-09-21 ENCOUNTER — Encounter: Payer: Self-pay | Admitting: Cardiology

## 2018-09-21 ENCOUNTER — Ambulatory Visit (INDEPENDENT_AMBULATORY_CARE_PROVIDER_SITE_OTHER): Payer: BLUE CROSS/BLUE SHIELD | Admitting: Cardiology

## 2018-09-21 VITALS — BP 128/80 | HR 94 | Ht 65.0 in | Wt 168.1 lb

## 2018-09-21 DIAGNOSIS — R002 Palpitations: Secondary | ICD-10-CM

## 2018-09-21 DIAGNOSIS — I4729 Other ventricular tachycardia: Secondary | ICD-10-CM

## 2018-09-21 DIAGNOSIS — I472 Ventricular tachycardia: Secondary | ICD-10-CM

## 2018-09-21 HISTORY — DX: Other ventricular tachycardia: I47.29

## 2018-09-21 HISTORY — DX: Ventricular tachycardia: I47.2

## 2018-09-21 NOTE — Patient Instructions (Signed)
Medication Instructions:  Your physician recommends that you continue on your current medications as directed. Please refer to the Current Medication list given to you today.  If you need a refill on your cardiac medications before your next appointment, please call your pharmacy.   Lab work: None  If you have labs (blood work) drawn today and your tests are completely normal, you will receive your results only by: Marland Kitchen MyChart Message (if you have MyChart) OR . A paper copy in the mail If you have any lab test that is abnormal or we need to change your treatment, we will call you to review the results.  Testing/Procedures: None  Follow-Up: At [facility], you and your health needs are our priority.  As part of our continuing mission to provide you with exceptional heart care, we have created designated Provider Care Teams.  These Care Teams include your primary Cardiologist (physician) and Advanced Practice Providers (APPs -  Physician Assistants and Nurse Practitioners) who all work together to provide you with the care you need, when you need it.  You will need a follow up appointment in 6 months.  Please call our office 2 months in advance to schedule this appointment.  You may see another member of our [employer] Provider Team in [place]: Jenne Campus, MD . Shirlee More, MD  Any Other Special Instructions Will Be Listed Below (If Applicable).

## 2018-09-21 NOTE — [place] (Signed)
Cardiology Office Note:    Date:  09/21/2018   ID:  Cynthia [name], DOB [birth year], MRN [id number]  PCP:  Maurice Small, MD  Cardiologist:  Jenean Lindau, MD   Referring MD: Maurice Small, MD    ASSESSMENT:    1. Nonsustained ventricular tachycardia (Cynthia [name])   2. Palpitations    PLAN:    In order of problems listed above:  1. Primary prevention stressed with the patient.  Importance of compliance with diet and medication stressed and she vocalized understanding.  Her blood pressure is stable. 2. She had multiple questions which were answered to her satisfaction.  She tells me that she feels much better now and is more palpitations since it saw me last.Patient will be seen in follow-up appointment in 6 months or earlier if the patient has any concerns    Medication Adjustments/Labs and Tests Ordered: Current medicines are reviewed at length with the patient today.  Concerns regarding medicines are outlined above.  No orders of the defined types were placed in this encounter.  No orders of the defined types were placed in this encounter.    No chief complaint on file.    History of Present Illness:    Cynthia [name] is a 42 y.o. female.  The patient was evaluated by me for palpitations.  Stress test was unremarkable.  She has good exercise tolerance asymptomatic and normal stress echo.  Holter monitoring revealed nonsustained ventricular tachycardia.  I reviewed her electrolytes and they are fine and she had 3-4 beats of nonsustained ventricular tachycardia or other PVCs in a row.  The patient also has been initiated on beta-blocker for this reason and also because of the fact that she has baseline tachycardia.  She denies any problems at this time and she has not been exercising on a regular basis.  No chest pain orthopnea or PND.  At the time of my evaluation, the patient is alert awake oriented and in no distress.  Past Medical History:  Diagnosis Date  . Diabetes mellitus without  complication ([place])   . Head trauma   . Hyperprolactinemia (Cynthia [name])   . Pituitary tumor    is now resolved  . Prediabetes     Past Surgical History:  Procedure Laterality Date  . CESAREAN SECTION      Current Medications: Current Meds  Medication Sig  . AVIANE 0.1-20 MG-MCG tablet   . fluticasone (FLONASE) 50 MCG/ACT nasal spray Place 1 spray into both nostrils daily.  . meclizine (ANTIVERT) 25 MG tablet Take 25 mg by mouth 2 (two) times daily as needed.  . metFORMIN (GLUCOPHAGE-XR) 500 MG 24 hr tablet   . metoprolol succinate (TOPROL-XL) 50 MG 24 hr tablet Take 1 tablet (50 mg total) by mouth daily.  Marland Kitchen omeprazole-sodium bicarbonate (ZEGERID) 40-1100 MG capsule Take 1 capsule by mouth daily before breakfast.     Allergies:   Penicillins   Social History   Socioeconomic History  . Marital status: Married    Spouse name: Not on file  . Number of children: Not on file  . Years of education: Not on file  . Highest education level: Not on file  Occupational History  . Not on file  Social Needs  . Financial resource strain: Not on file  . Food insecurity:    Worry: Not on file    Inability: Not on file  . Transportation needs:    Medical: Not on file    Non-medical: Not on file  Tobacco  Use  . Smoking status: Former Smoker  . Smokeless tobacco: Never Used  Substance and Sexual Activity  . Alcohol use: Yes    Comment: "very rarely"  . Drug use: Never  . Sexual activity: Not on file  Lifestyle  . Physical activity:    Days per week: Not on file    Minutes per session: Not on file  . Stress: Not on file  Relationships  . Social connections:    Talks on phone: Not on file    Gets together: Not on file    Attends religious service: Not on file    Active member of club or organization: Not on file    Attends meetings of clubs or organizations: Not on file    Relationship status: Not on file  Other Topics Concern  . Not on file  Social History Narrative  . Not on  file     Family History: The patient's family history is not on file.  ROS:   Please see the history of present illness.    All other systems reviewed and are negative.  EKGs/Labs/Other Studies Reviewed:    The following studies were reviewed today: I discussed the findings of the echo and stress echo and monitoring with the patient at extensive length.   Recent Labs: 03/27/2018: ALT 10; BUN 12; Creatinine, Ser 0.72; Hemoglobin 12.8; Platelets 230; Potassium 4.1; Sodium 139; TSH 2.540  Recent Lipid Panel    Component Value Date/Time   CHOL 151 03/27/2018 0830   TRIG 120 03/27/2018 0830   HDL 46 03/27/2018 0830   CHOLHDL 3.3 03/27/2018 0830   LDLCALC 81 03/27/2018 0830    Physical Exam:    VS:  BP 128/80 (BP Location: Right Arm, Patient Position: Sitting, Cuff Size: Normal)   Pulse 94   Ht 5\' 5"  (1.651 m)   Wt 168 lb 1.9 oz (76.3 kg)   SpO2 98%   BMI 27.98 kg/m     Wt Readings from Last 3 Encounters:  09/21/18 168 lb 1.9 oz (76.3 kg)  03/03/18 176 lb 12.8 oz (80.2 kg)  02/03/18 174 lb (78.9 kg)     GEN: Patient is in no acute distress HEENT: Normal NECK: No JVD; No carotid bruits LYMPHATICS: No lymphadenopathy CARDIAC: Hear sounds regular, 2/6 systolic murmur at the apex. RESPIRATORY:  Clear to auscultation without rales, wheezing or rhonchi  ABDOMEN: Soft, non-tender, non-distended MUSCULOSKELETAL:  No edema; No deformity  SKIN: Warm and dry NEUROLOGIC:  Alert and oriented x 3 PSYCHIATRIC:  Normal affect   Signed, Jenean Lindau, MD  09/21/2018 11:51 AM    Cynthia [name]

## 2019-01-13 ENCOUNTER — Other Ambulatory Visit: Payer: Self-pay | Admitting: Family Medicine

## 2019-01-13 ENCOUNTER — Ambulatory Visit
Admission: RE | Admit: 2019-01-13 | Discharge: 2019-01-13 | Disposition: A | Discharge status: Home / Self Care | Payer: BLUE CROSS/BLUE SHIELD | Source: Ambulatory Visit | Attending: Family Medicine | Admitting: Family Medicine

## 2019-01-13 DIAGNOSIS — M545 Low back pain, unspecified: Secondary | ICD-10-CM

## 2019-03-07 ENCOUNTER — Other Ambulatory Visit: Payer: Self-pay | Admitting: Cardiology

## 2019-03-08 NOTE — Telephone Encounter (Signed)
Rx refill sent to pharmacy. 

## 2019-06-05 ENCOUNTER — Other Ambulatory Visit: Payer: Self-pay | Admitting: Cardiology

## 2019-06-07 ENCOUNTER — Other Ambulatory Visit: Payer: Self-pay

## 2019-06-07 MED ORDER — METOPROLOL SUCCINATE ER 50 MG PO TB24: 50.0000 mg | ORAL_TABLET | Freq: Every day | ORAL | 0 refills | Status: DC

## 2019-08-30 ENCOUNTER — Other Ambulatory Visit (HOSPITAL_COMMUNITY)
Admission: RE | Admit: 2019-08-30 | Discharge: 2019-08-30 | Disposition: A | Discharge status: Home / Self Care | Payer: BC Managed Care – PPO | Source: Ambulatory Visit | Attending: Obstetrics and Gynecology | Admitting: Obstetrics and Gynecology

## 2019-08-30 ENCOUNTER — Other Ambulatory Visit: Payer: Self-pay | Admitting: Obstetrics and Gynecology

## 2019-08-30 DIAGNOSIS — Z01419 Encounter for gynecological examination (general) (routine) without abnormal findings: Secondary | ICD-10-CM | POA: Diagnosis not present

## 2019-09-02 LAB — CYTOLOGY - PAP
Comment: NEGATIVE
Diagnosis: NEGATIVE
High risk HPV: NEGATIVE

## 2019-09-05 ENCOUNTER — Telehealth: Payer: Self-pay | Admitting: Cardiology

## 2019-09-07 NOTE — Telephone Encounter (Signed)
Please send refill for Metoprolol to pharmacy

## 2019-09-08 NOTE — Telephone Encounter (Signed)
RN called patient to remind her that refill was sent yesterday. She verified that it is at pharmacy ready for pickup.

## 2019-10-04 ENCOUNTER — Other Ambulatory Visit: Payer: Self-pay | Admitting: Cardiology

## 2019-10-05 NOTE — Telephone Encounter (Signed)
Please refill metoprolol, patient has scheduled a follow up appt

## 2019-10-13 ENCOUNTER — Encounter: Payer: Self-pay | Admitting: Cardiology

## 2019-10-13 ENCOUNTER — Ambulatory Visit (INDEPENDENT_AMBULATORY_CARE_PROVIDER_SITE_OTHER): Payer: BC Managed Care – PPO | Admitting: Cardiology

## 2019-10-13 ENCOUNTER — Other Ambulatory Visit: Payer: Self-pay

## 2019-10-13 VITALS — BP 102/70 | HR 84 | Resp 16 | Wt 177.0 lb

## 2019-10-13 DIAGNOSIS — I4729 Other ventricular tachycardia: Secondary | ICD-10-CM

## 2019-10-13 DIAGNOSIS — I472 Ventricular tachycardia: Secondary | ICD-10-CM

## 2019-10-13 NOTE — Addendum Note (Signed)
Addended by: Beckey Rutter on: 10/13/2019 02:38 PM   Modules accepted: Orders

## 2019-10-13 NOTE — [place] Notes (Signed)
Cardiology Office Note:    Date:  10/13/2019   ID:  Cynthia [name], DOB [birth year], MRN [id number]  PCP:  Maurice Small, MD  Cardiologist:  Jenean Lindau, MD   Referring MD: Maurice Small, MD    ASSESSMENT:    1. Nonsustained ventricular tachycardia ([place])    PLAN:    In order of problems listed above:  1. Nonsustained ventricular tachycardia: Stable at this time.  Patient is asymptomatic.  Earlier evaluation in the past revealed her to have a abnormal stress test and echocardiogram.  I would like to get a Chem-7 since it has been somewhat since she had any blood work. 2. Diabetes mellitus: Managed by her primary care physician.  Diet was discussed.  Lipids also followed by primary care she will discuss these issues with her doctor. 3. Patient will be seen in follow-up appointment in 12 months or earlier if the patient has any concerns    Medication Adjustments/Labs and Tests Ordered: Current medicines are reviewed at length with the patient today.  Concerns regarding medicines are outlined above.  Orders Placed This Encounter  Procedures  . EKG 12-Lead   No orders of the defined types were placed in this encounter.    Chief Complaint  Patient presents with  . Follow-up     History of Present Illness:    Cynthia [name] is a 43 y.o. female.  Patient has past medical history of nonsustained ventricular tachycardia.  She denies any problems at this time and takes care of activities of daily living.  No chest pain orthopnea or PND.  At the time of my evaluation, the patient is alert awake oriented and in no distress.  Past Medical History:  Diagnosis Date  . Diabetes mellitus without complication ([place])   . Head trauma   . Hyperprolactinemia ([place])   . Pituitary tumor    is now resolved  . Prediabetes     Past Surgical History:  Procedure Laterality Date  . CESAREAN SECTION      Current Medications: Current Meds  Medication Sig  . AVIANE 0.1-20 MG-MCG tablet 1 tablet  daily.   . fluticasone (FLONASE) 50 MCG/ACT nasal spray Place 1 spray into both nostrils daily.  . meclizine (ANTIVERT) 25 MG tablet Take 25 mg by mouth 2 (two) times daily as needed.  . metFORMIN (GLUCOPHAGE-XR) 500 MG 24 hr tablet 500 mg 3 (three) times daily.   . metoprolol succinate (TOPROL-XL) 50 MG 24 hr tablet TAKE 1 TABLET BY MOUTH ONCE DAILY WITH OR  IMMEDIATELY FOLLOWING A MEAL  . omeprazole-sodium bicarbonate (ZEGERID) 40-1100 MG capsule Take 1 capsule by mouth daily before breakfast.     Allergies:   Penicillins   Social History   Socioeconomic History  . Marital status: Married    Spouse name: Not on file  . Number of children: Not on file  . Years of education: Not on file  . Highest education level: Not on file  Occupational History  . Not on file  Social Needs  . Financial resource strain: Not on file  . Food insecurity    Worry: Not on file    Inability: Not on file  . Transportation needs    Medical: Not on file    Non-medical: Not on file  Tobacco Use  . Smoking status: Former Research scientist (life sciences)  . Smokeless tobacco: Never Used  Substance and Sexual Activity  . Alcohol use: Not Currently    Comment: "very rarely"  . Drug use: Never  .  Sexual activity: Not on file  Lifestyle  . Physical activity    Days per week: Not on file    Minutes per session: Not on file  . Stress: Not on file  Relationships  . Social Herbalist on phone: Not on file    Gets together: Not on file    Attends religious service: Not on file    Active member of club or organization: Not on file    Attends meetings of clubs or organizations: Not on file    Relationship status: Not on file  Other Topics Concern  . Not on file  Social History Narrative  . Not on file     Family History: The patient's family history is not on file.  ROS:   Please see the history of present illness.    All other systems reviewed and are negative.  EKGs/Labs/Other Studies Reviewed:    The  following studies were reviewed today: EKG reveals sinus rhythm and nonspecific ST-T changes.   Recent Labs: No results found for requested labs within last 8760 hours.  Recent Lipid Panel    Component Value Date/Time   CHOL 151 03/27/2018 0830   TRIG 120 03/27/2018 0830   HDL 46 03/27/2018 0830   CHOLHDL 3.3 03/27/2018 0830   LDLCALC 81 03/27/2018 0830    Physical Exam:    VS:  BP 102/70 (BP Location: Right Arm, Patient Position: Sitting)   Pulse 84   Resp 16   Wt 177 lb (80.3 kg)   BMI 29.45 kg/m     Wt Readings from Last 3 Encounters:  10/13/19 177 lb (80.3 kg)  09/21/18 168 lb 1.9 oz (76.3 kg)  03/03/18 176 lb 12.8 oz (80.2 kg)     GEN: Patient is in no acute distress HEENT: Normal NECK: No JVD; No carotid bruits LYMPHATICS: No lymphadenopathy CARDIAC: Hear sounds regular, 2/6 systolic murmur at the apex. RESPIRATORY:  Clear to auscultation without rales, wheezing or rhonchi  ABDOMEN: Soft, non-tender, non-distended MUSCULOSKELETAL:  No edema; No deformity  SKIN: Warm and dry NEUROLOGIC:  Alert and oriented x 3 PSYCHIATRIC:  Normal affect   Signed, Jenean Lindau, MD  10/13/2019 2:29 PM    [place]

## 2019-10-13 NOTE — Patient Instructions (Signed)
Medication Instructions:  Your physician recommends that you continue on your current medications as directed. Please refer to the Current Medication list given to you today.  *If you need a refill on your cardiac medications before your next appointment, please call your pharmacy*  Lab Work: Your physician recommends that you have a BMP drawn today  If you have labs (blood work) drawn today and your tests are completely normal, you will receive your results only by: Marland Kitchen MyChart Message (if you have MyChart) OR . A paper copy in the mail If you have any lab test that is abnormal or we need to change your treatment, we will call you to review the results.  Testing/Procedures: You had an EKG performed today  Follow-Up: At [facility], you and your health needs are our priority.  As part of our continuing mission to provide you with exceptional heart care, we have created designated Provider Care Teams.  These Care Teams include your primary Cardiologist (physician) and Advanced Practice Providers (APPs -  Physician Assistants and Nurse Practitioners) who all work together to provide you with the care you need, when you need it.  Your next appointment:   12 month(s)  The format for your next appointment:   In Person  Provider:   Jyl Heinz, MD

## 2019-10-14 LAB — BASIC METABOLIC PANEL
BUN/Creatinine Ratio: 21 (ref 9–23)
BUN: 15 mg/dL (ref 6–24)
CO2: 22 mmol/L (ref 20–29)
Calcium: 9.1 mg/dL (ref 8.7–10.2)
Chloride: 104 mmol/L (ref 96–106)
Creatinine, Ser: 0.73 mg/dL (ref 0.57–1.00)
GFR calc Af Amer: 115 mL/min/{1.73_m2} (ref >59)
GFR calc non Af Amer: 100 mL/min/{1.73_m2} (ref >59)
Glucose: 114 mg/dL — ABNORMAL HIGH (ref 65–99)
Potassium: 4.1 mmol/L (ref 3.5–5.2)
Sodium: 139 mmol/L (ref 134–144)

## 2019-10-19 ENCOUNTER — Telehealth: Payer: Self-pay

## 2019-10-19 NOTE — Telephone Encounter (Signed)
-----   Message from Jenean Lindau, MD sent at 10/14/2019  9:38 AM EST ----- The results of the study is unremarkable. Please inform patient. I will discuss in detail at next appointment. Cc  primary care/referring physician Jenean Lindau, MD 10/14/2019 9:37 AM

## 2019-10-19 NOTE — Telephone Encounter (Signed)
Left message that results were ok, copy sent to Dr. Justin Mend

## 2019-11-08 ENCOUNTER — Other Ambulatory Visit: Payer: Self-pay | Admitting: Cardiology

## 2019-11-12 ENCOUNTER — Other Ambulatory Visit: Payer: Self-pay

## 2019-11-12 ENCOUNTER — Ambulatory Visit (HOSPITAL_COMMUNITY)
Admission: EM | Admit: 2019-11-12 | Discharge: 2019-11-12 | Disposition: A | Discharge status: Home / Self Care | Payer: BC Managed Care – PPO | Attending: Emergency Medicine | Admitting: Emergency Medicine

## 2019-11-12 ENCOUNTER — Encounter (HOSPITAL_COMMUNITY): Payer: Self-pay

## 2019-11-12 DIAGNOSIS — R432 Parageusia: Secondary | ICD-10-CM | POA: Diagnosis present

## 2019-11-12 DIAGNOSIS — Z87891 Personal history of nicotine dependence: Secondary | ICD-10-CM | POA: Diagnosis not present

## 2019-11-12 DIAGNOSIS — R5383 Other fatigue: Secondary | ICD-10-CM

## 2019-11-12 DIAGNOSIS — Z20822 Contact with and (suspected) exposure to covid-19: Secondary | ICD-10-CM | POA: Diagnosis not present

## 2019-11-12 DIAGNOSIS — U071 COVID-19: Secondary | ICD-10-CM | POA: Diagnosis not present

## 2019-11-12 DIAGNOSIS — Z79899 Other long term (current) drug therapy: Secondary | ICD-10-CM | POA: Insufficient documentation

## 2019-11-12 DIAGNOSIS — E119 Type 2 diabetes mellitus without complications: Secondary | ICD-10-CM | POA: Diagnosis not present

## 2019-11-12 DIAGNOSIS — R110 Nausea: Secondary | ICD-10-CM

## 2019-11-12 DIAGNOSIS — Z880 Allergy status to penicillin: Secondary | ICD-10-CM | POA: Insufficient documentation

## 2019-11-12 DIAGNOSIS — Z7984 Long term (current) use of oral hypoglycemic drugs: Secondary | ICD-10-CM | POA: Insufficient documentation

## 2019-11-12 DIAGNOSIS — R519 Headache, unspecified: Secondary | ICD-10-CM

## 2019-11-12 NOTE — Discharge Instructions (Signed)
Self isolate until covid results are back. If positive will isolate for 14 days from onset of symptoms.  Will notify you by phone of any positive findings. Your negative results will be sent through your MyChart.  Push fluids to ensure adequate hydration and keep secretions thin.  Tylenol and/or ibuprofen as needed for pain or fevers.  Rest You may try some vitamins to help your immune system potentially:  Vitamin C 500mg  twice a day. Zing 50mg  daily. Vitamin D 5000IU daily.   Please seek further evaluation if develop worsening shortness of breath or chest pain .

## 2019-11-12 NOTE — ED Triage Notes (Signed)
Pt present exposure to covid 19 from her boyfriend. Symptoms started on wednesday and they are  fatigue, loss of taste and headache

## 2019-11-12 NOTE — ED Provider Notes (Signed)
[facility]    CSN: [id number] Arrival date & time: 11/12/19  1340      History   Chief Complaint Chief Complaint  Patient presents with  . Covid 19    HPI Cynthia [name] is a 44 y.o. female.   Maddyx JAKHYA TEKLE presents with complaints of symptoms which started 12/29, started with chills. Headache, fevers, body aches, cough, decreased appetite, decreased taste. No congestion. Mild nausea, no vomiting or diarrhea. No shortness of breath , no chest pain. Brother -in- law tested positive three days ago for covid-19. Was around him last on 12/27. Has been taking advil which has helped for headache. History  Of prediabetes, head trauma, tachycardia.    ROS per HPI, negative if not otherwise mentioned.      Past Medical History:  Diagnosis Date  . Diabetes mellitus without complication ([place])   . Head trauma   . Hyperprolactinemia ([place])   . Pituitary tumor    is now resolved  . Prediabetes     Patient Active Problem List   Diagnosis Date Noted  . Nonsustained ventricular tachycardia ([place]) 09/21/2018  . Palpitations 03/03/2018  . Pituitary tumor   . Hyperprolactinemia ([place])   . Head trauma     Past Surgical History:  Procedure Laterality Date  . CESAREAN SECTION      OB History   No obstetric history on file.      Home Medications    Prior to Admission medications   Medication Sig Start Date End Date Taking? Authorizing Provider  AVIANE 0.1-20 MG-MCG tablet 1 tablet daily.  12/22/17   [provider]  fluticasone (FLONASE) 50 MCG/ACT nasal spray Place 1 spray into both nostrils daily.    [provider]  meclizine (ANTIVERT) 25 MG tablet Take 25 mg by mouth 2 (two) times daily as needed.    [provider]  metFORMIN (GLUCOPHAGE-XR) 500 MG 24 hr tablet 500 mg 3 (three) times daily.  12/17/17   [provider]  metoprolol succinate (TOPROL-XL) 50 MG 24 hr tablet TAKE 1 TABLET BY MOUTH ONCE DAILY WITH A MEAL OR IMMEDIATELY   FOLLOWING  A  MEAL 11/08/19   Revankar, Reita Cliche, MD  omeprazole-sodium bicarbonate (ZEGERID) 40-1100 MG capsule Take 1 capsule by mouth daily before breakfast.    [provider]    Family History History reviewed. No pertinent family history.  Social History Social History   Tobacco Use  . Smoking status: Former Research scientist (life sciences)  . Smokeless tobacco: Never Used  Substance Use Topics  . Alcohol use: Not Currently    Comment: "very rarely"  . Drug use: Never     Allergies   Penicillins   Review of Systems Review of Systems   Physical Exam Triage Vital Signs ED Triage Vitals  Enc Vitals Group     BP 11/12/19 1353 119/83     Pulse Rate 11/12/19 1353 (!) 111     Resp 11/12/19 1353 16     Temp 11/12/19 1353 99.3 F (37.4 C)     Temp Source 11/12/19 1353 Oral     SpO2 11/12/19 1353 97 %     Weight --      Height --      Head Circumference --      Peak Flow --      Pain Score 11/12/19 1352 0     Pain Loc --      Pain Edu? --      Excl. in [place]? --  No data found.  Updated Vital Signs BP 119/83 (BP Location: Right Arm)   Pulse (!) 111   Temp 99.3 F (37.4 C) (Oral)   Resp 16   LMP 10/22/2019   SpO2 97%   Visual Acuity Right Eye Distance:   Left Eye Distance:   Bilateral Distance:    Right Eye Near:   Left Eye Near:    Bilateral Near:     Physical Exam Constitutional:      General: She is not in acute distress.    Appearance: She is well-developed.  Cardiovascular:     Rate and Rhythm: Tachycardia present.  Pulmonary:     Effort: Pulmonary effort is normal.  Skin:    General: Skin is warm and dry.  Neurological:     Mental Status: She is alert and oriented to person, place, and time.      UC Treatments / Results  Labs (all labs ordered are listed, but only abnormal results are displayed) Labs Reviewed  NOVEL CORONAVIRUS, NAA (HOSP ORDER, SEND-OUT TO REF LAB; TAT 18-24 HRS)    EKG   Radiology No results  found.  Procedures Procedures (including critical care time)  Medications Ordered in UC Medications - No data to display  Initial Impression / Assessment and Plan / UC Course  I have reviewed the triage vital signs and the nursing notes.  Pertinent labs & imaging results that were available during my care of the patient were reviewed by me and considered in my medical decision making (see chart for details).     Non toxic. Benign physical exam.  Tachycardia noted, per chart review patient has had similar heart rates in the past. No chest pain , no shortness of breath . No dizziness. URI symptoms s/p exposure to covid-19. Supportive cares recommended. covid testing pending. Isolation discussed. Return precautions provided. Patient verbalized understanding and agreeable to plan.   Final Clinical Impressions(s) / UC Diagnoses   Final diagnoses:  Exposure to COVID-19 virus  Acute nonintractable headache, unspecified headache type  Loss of taste  Fatigue, unspecified type     Discharge Instructions     Self isolate until covid results are back. If positive will isolate for 14 days from onset of symptoms.  Will notify you by phone of any positive findings. Your negative results will be sent through your MyChart.  Push fluids to ensure adequate hydration and keep secretions thin.  Tylenol and/or ibuprofen as needed for pain or fevers.  Rest You may try some vitamins to help your immune system potentially:  Vitamin C 500mg  twice a day. Zing 50mg  daily. Vitamin D 5000IU daily.   Please seek further evaluation if develop worsening shortness of breath or chest pain .    ED Prescriptions    None     PDMP not reviewed this encounter.   Zigmund Gottron, NP 11/12/19 1427

## 2019-11-14 LAB — NOVEL CORONAVIRUS, NAA (HOSP ORDER, SEND-OUT TO REF LAB; TAT 18-24 HRS): SARS-CoV-2, NAA: DETECTED — AB

## 2019-11-15 ENCOUNTER — Telehealth (HOSPITAL_COMMUNITY): Payer: Self-pay | Admitting: Emergency Medicine

## 2019-11-15 NOTE — Telephone Encounter (Signed)

## 2020-02-07 ENCOUNTER — Telehealth: Payer: Self-pay

## 2020-02-07 NOTE — Telephone Encounter (Signed)
NOTES ON FILE FROM [facility] at [place]

## 2020-02-17 ENCOUNTER — Other Ambulatory Visit: Payer: Self-pay

## 2020-02-17 ENCOUNTER — Ambulatory Visit (INDEPENDENT_AMBULATORY_CARE_PROVIDER_SITE_OTHER): Payer: BC Managed Care – PPO | Admitting: Cardiology

## 2020-02-17 ENCOUNTER — Encounter: Payer: Self-pay | Admitting: Cardiology

## 2020-02-17 ENCOUNTER — Ambulatory Visit (INDEPENDENT_AMBULATORY_CARE_PROVIDER_SITE_OTHER): Payer: BC Managed Care – PPO

## 2020-02-17 VITALS — BP 108/82 | HR 68 | Ht 65.0 in | Wt 171.0 lb

## 2020-02-17 DIAGNOSIS — I472 Ventricular tachycardia: Secondary | ICD-10-CM

## 2020-02-17 DIAGNOSIS — R002 Palpitations: Secondary | ICD-10-CM

## 2020-02-17 DIAGNOSIS — I4729 Other ventricular tachycardia: Secondary | ICD-10-CM

## 2020-02-17 DIAGNOSIS — E088 Diabetes mellitus due to underlying condition with unspecified complications: Secondary | ICD-10-CM

## 2020-02-17 HISTORY — DX: Diabetes mellitus due to underlying condition with unspecified complications: E08.8

## 2020-02-17 NOTE — Progress Notes (Signed)
Cardiology Office Note:    Date:  02/17/2020   ID:  Cynthia [name], DOB July 18, 1976, MRN [id number]  PCP:  Maurice Small, MD  Cardiologist:  Jenean Lindau, MD   Referring MD: Maurice Small, MD    ASSESSMENT:    1. Nonsustained ventricular tachycardia ([place])   2. Diabetes mellitus due to underlying condition with unspecified complications ([place])   3. Palpitations    PLAN:    In order of problems listed above:  1. Palpitations: I discussed my findings with the patient at length.  In view of her history of nonsustained ventricular tachycardia we will do a 14-day event monitoring.  Her TSH was done recently and I reviewed it and it was unremarkable. 2. Diabetes mellitus: Managed by primary care physician.  Importance of compliance with exercise stressed.  She was advised to walk at least 30 minutes a day at least 5 times a week and she promises to do so. 3. In view of the above I will get her back in the next few days for liver lipid check.  By guidelines directed medical therapy she should be on a statin.  I discussed this with her and she is agreeable.  She will have a Chem-7 and liver lipid panel today.Patient will be seen in follow-up appointment in 6 months or earlier if the patient has any concerns    Medication Adjustments/Labs and Tests Ordered: Current medicines are reviewed at length with the patient today.  Concerns regarding medicines are outlined above.  Orders Placed This Encounter  Procedures  . Hepatic function panel  . Lipid panel  . LONG TERM MONITOR (3-14 DAYS)   No orders of the defined types were placed in this encounter.    No chief complaint on file.    History of Present Illness:    Cynthia [name] is a 44 y.o. female.  Patient was evaluated in the past by me for nonsustained ventricular tachycardia.  She is a diabetic.  She denies any problems at this time and takes care of activities of daily living.  No chest pain orthopnea or PND.  At the time of my evaluation,  the patient is alert awake oriented and in no distress.  She complains of palpitations for the next 2 or 3 times a week.  This has a concern.  No dizzy spells no syncope.  Past Medical History:  Diagnosis Date  . Diabetes mellitus without complication ([place])   . Head trauma   . Hyperprolactinemia ([place])   . Pituitary tumor    is now resolved  . Prediabetes     Past Surgical History:  Procedure Laterality Date  . CESAREAN SECTION      Current Medications: Current Meds  Medication Sig  . AVIANE 0.1-20 MG-MCG tablet 1 tablet daily.   . fexofenadine (ALLEGRA) 180 MG tablet Take 180 mg by mouth daily.  . fluticasone (FLONASE) 50 MCG/ACT nasal spray Place 1 spray into both nostrils daily.  . meclizine (ANTIVERT) 25 MG tablet Take 25 mg by mouth 2 (two) times daily as needed.  . metFORMIN (GLUCOPHAGE-XR) 500 MG 24 hr tablet 500 mg 3 (three) times daily.   . metoprolol succinate (TOPROL-XL) 50 MG 24 hr tablet TAKE 1 TABLET BY MOUTH ONCE DAILY WITH A MEAL OR IMMEDIATELY  FOLLOWING  A  MEAL  . omeprazole-sodium bicarbonate (ZEGERID) 40-1100 MG capsule Take 1 capsule by mouth daily before breakfast.     Allergies:   Penicillins   Social History  Socioeconomic History  . Marital status: Married    Spouse name: Not on file  . Number of children: Not on file  . Years of education: Not on file  . Highest education level: Not on file  Occupational History  . Not on file  Tobacco Use  . Smoking status: Former Research scientist (life sciences)  . Smokeless tobacco: Never Used  Substance and Sexual Activity  . Alcohol use: Not Currently    Comment: "very rarely"  . Drug use: Never  . Sexual activity: Not on file  Other Topics Concern  . Not on file  Social History Narrative  . Not on file   Social Determinants of Health   Financial Resource Strain:   . Difficulty of Paying Living Expenses:   Food Insecurity:   . Worried About Charity fundraiser in the Last Year:   . Arboriculturist in the Last Year:     Transportation Needs:   . Film/video editor (Medical):   Marland Kitchen Lack of Transportation (Non-Medical):   Physical Activity:   . Days of Exercise per Week:   . Minutes of Exercise per Session:   Stress:   . Feeling of Stress :   Social Connections:   . Frequency of Communication with Friends and Family:   . Frequency of Social Gatherings with Friends and Family:   . Attends Religious Services:   . Active Member of Clubs or Organizations:   . Attends Archivist Meetings:   Marland Kitchen Marital Status:      Family History: The patient's family history is not on file.  ROS:   Please see the history of present illness.    All other systems reviewed and are negative.  EKGs/Labs/Other Studies Reviewed:    The following studies were reviewed today: I discussed lab work with the patient at [facility]: 10/13/2019: BUN 15; Creatinine, Ser 0.73; Potassium 4.1; Sodium 139  Recent Lipid Panel    Component Value Date/Time   CHOL 151 03/27/2018 0830   TRIG 120 03/27/2018 0830   HDL 46 03/27/2018 0830   CHOLHDL 3.3 03/27/2018 0830   LDLCALC 81 03/27/2018 0830    Physical Exam:    VS:  BP 108/82   Pulse 68   Ht 5\' 5"  (1.651 m)   Wt 171 lb (77.6 kg)   SpO2 97%   BMI 28.46 kg/m     Wt Readings from Last 3 Encounters:  02/17/20 171 lb (77.6 kg)  10/13/19 177 lb (80.3 kg)  09/21/18 168 lb 1.9 oz (76.3 kg)     GEN: Patient is in no acute distress HEENT: Normal NECK: No JVD; No carotid bruits LYMPHATICS: No lymphadenopathy CARDIAC: Hear sounds regular, 2/6 systolic murmur at the apex. RESPIRATORY:  Clear to auscultation without rales, wheezing or rhonchi  ABDOMEN: Soft, non-tender, non-distended MUSCULOSKELETAL:  No edema; No deformity  SKIN: Warm and dry NEUROLOGIC:  Alert and oriented x 3 PSYCHIATRIC:  Normal affect   Signed, Jenean Lindau, MD  02/17/2020 3:48 PM    [facility]

## 2020-02-17 NOTE — Patient Instructions (Signed)
Medication Instructions:  No medication changes. *If you need a refill on your cardiac medications before your next appointment, please call your pharmacy*   Lab Work: Your physician recommends that you return for lab work in: the next few days. You need to have labs done when you are fasting.  You can come Monday through Friday 8:30 am to 12:00 pm and 1:15 to 4:30. You do not need to make an appointment as the order has already been placed. The labs you are going to have done are LFT and Lipids.   If you have labs (blood work) drawn today and your tests are completely normal, you will receive your results only by: Marland Kitchen MyChart Message (if you have MyChart) OR . A paper copy in the mail If you have any lab test that is abnormal or we need to change your treatment, we will call you to review the results.   Testing/Procedures:  WHY IS MY DOCTOR PRESCRIBING ZIO? The Zio system is proven and trusted by physicians to detect and diagnose irregular heart rhythms -- and has been prescribed to hundreds of thousands of patients.  The FDA has cleared the Zio system to monitor for many different kinds of irregular heart rhythms. In a study, physicians were able to reach a diagnosis 90% of the time with the Zio system1.  You can wear the Zio monitor -- a small, discreet, comfortable patch -- during your normal day-to-day activity, including while you sleep, shower, and exercise, while it records every single heartbeat for analysis.  1Barrett, P., et al. Comparison of 24 Hour Holter Monitoring Versus 14 Day Novel Adhesive Patch Electrocardiographic Monitoring. Stevinson, 2014.  ZIO VS. HOLTER MONITORING The Zio monitor can be comfortably worn for up to 14 days. Holter monitors can be worn for 24 to 48 hours, limiting the time to record any irregular heart rhythms you may have. Zio is able to capture data for the 51% of patients who have their first symptom-triggered arrhythmia after 48  hours.1  LIVE WITHOUT RESTRICTIONS The Zio ambulatory cardiac monitor is a small, unobtrusive, and water-resistant patch--you might even forget you're wearing it. The Zio monitor records and stores every beat of your heart, whether you're sleeping, working out, or showering.   Wear the monitor for 14 days and remove on 03/02/20.   Follow-Up: At [facility], you and your health needs are our priority.  As part of our continuing mission to provide you with exceptional heart care, we have created designated Provider Care Teams.  These Care Teams include your primary Cardiologist (physician) and Advanced Practice Providers (APPs -  Physician Assistants and Nurse Practitioners) who all work together to provide you with the care you need, when you need it.  We recommend signing up for the patient portal called "MyChart".  Sign up information is provided on this After Visit Summary.  MyChart is used to connect with patients for Virtual Visits (Telemedicine).  Patients are able to view lab/test results, encounter notes, upcoming appointments, etc.  Non-urgent messages can be sent to your provider as well.   To learn more about what you can do with MyChart, go to [email or web address].    Your next appointment:   6 month(s)  The format for your next appointment:   In Person  Provider:   Jyl Heinz, MD   Other Instructions NA

## 2020-02-22 LAB — LIPID PANEL
Chol/HDL Ratio: 3.5 ratio (ref 0.0–4.4)
Cholesterol, Total: 169 mg/dL (ref 100–199)
HDL: 48 mg/dL (ref >39)
LDL Chol Calc (NIH): 103 mg/dL — ABNORMAL HIGH (ref 0–99)
Triglycerides: 97 mg/dL (ref 0–149)
VLDL Cholesterol Cal: 18 mg/dL (ref 5–40)

## 2020-02-22 LAB — HEPATIC FUNCTION PANEL
ALT: 11 IU/L (ref 0–32)
AST: 17 IU/L (ref 0–40)
Albumin: 4.2 g/dL (ref 3.8–4.8)
Alkaline Phosphatase: 74 IU/L (ref 39–117)
Bilirubin Total: 0.6 mg/dL (ref 0.0–1.2)
Bilirubin, Direct: 0.15 mg/dL (ref 0.00–0.40)
Total Protein: 6.5 g/dL (ref 6.0–8.5)

## 2020-02-23 ENCOUNTER — Telehealth: Payer: Self-pay | Admitting: Emergency Medicine

## 2020-02-23 DIAGNOSIS — Z79899 Other long term (current) drug therapy: Secondary | ICD-10-CM

## 2020-02-23 MED ORDER — ATORVASTATIN CALCIUM 10 MG PO TABS: 10.0000 mg | ORAL_TABLET | Freq: Every day | ORAL | 2 refills | Status: DC

## 2020-02-23 NOTE — Telephone Encounter (Signed)
Patient informed of results.  

## 2020-02-23 NOTE — Telephone Encounter (Signed)
Patient returning call  about lab results. Transferred call to the nurse.

## 2020-02-23 NOTE — Addendum Note (Signed)
Addended by: Linna Hoff R on: 02/23/2020 10:14 AM   Modules accepted: Orders

## 2020-02-23 NOTE — Telephone Encounter (Signed)
Patient called back and was informed of results and recommendations per Dr. Geraldo Pitter. She will start lipitor 10 mg daily and have fasting labs drawn in 6 weeks. NO further questions.

## 2020-03-06 ENCOUNTER — Other Ambulatory Visit: Payer: Self-pay | Admitting: Cardiology

## 2020-03-06 NOTE — Telephone Encounter (Signed)
Rx refill sent to pharmacy. 

## 2020-03-17 NOTE — Addendum Note (Signed)
Addended by: Truddie Hidden on: 03/17/2020 08:49 AM   Modules accepted: Orders

## 2020-04-12 LAB — LIPID PANEL
Chol/HDL Ratio: 2.9 ratio (ref 0.0–4.4)
Cholesterol, Total: 132 mg/dL (ref 100–199)
HDL: 46 mg/dL (ref >39)
LDL Chol Calc (NIH): 71 mg/dL (ref 0–99)
Triglycerides: 77 mg/dL (ref 0–149)
VLDL Cholesterol Cal: 15 mg/dL (ref 5–40)

## 2020-04-12 LAB — BASIC METABOLIC PANEL
BUN/Creatinine Ratio: 21 (ref 9–23)
BUN: 14 mg/dL (ref 6–24)
CO2: 22 mmol/L (ref 20–29)
Calcium: 8.9 mg/dL (ref 8.7–10.2)
Chloride: 106 mmol/L (ref 96–106)
Creatinine, Ser: 0.67 mg/dL (ref 0.57–1.00)
GFR calc Af Amer: 123 mL/min/{1.73_m2} (ref >59)
GFR calc non Af Amer: 106 mL/min/{1.73_m2} (ref >59)
Glucose: 91 mg/dL (ref 65–99)
Potassium: 4.1 mmol/L (ref 3.5–5.2)
Sodium: 141 mmol/L (ref 134–144)

## 2020-04-12 LAB — HEPATIC FUNCTION PANEL
ALT: 14 IU/L (ref 0–32)
AST: 12 IU/L (ref 0–40)
Albumin: 4.1 g/dL (ref 3.8–4.8)
Alkaline Phosphatase: 61 IU/L (ref 48–121)
Bilirubin Total: 0.4 mg/dL (ref 0.0–1.2)
Bilirubin, Direct: 0.13 mg/dL (ref 0.00–0.40)
Total Protein: 6.6 g/dL (ref 6.0–8.5)

## 2020-04-12 NOTE — Addendum Note (Signed)
Addended by: Truddie Hidden on: 04/12/2020 01:08 PM   Modules accepted: Orders

## 2020-05-30 ENCOUNTER — Other Ambulatory Visit: Payer: Self-pay | Admitting: Cardiology

## 2020-05-30 LAB — LIPID PANEL
Chol/HDL Ratio: 3 ratio (ref 0.0–4.4)
Cholesterol, Total: 142 mg/dL (ref 100–199)
HDL: 47 mg/dL (ref >39)
LDL Chol Calc (NIH): 78 mg/dL (ref 0–99)
Triglycerides: 89 mg/dL (ref 0–149)
VLDL Cholesterol Cal: 17 mg/dL (ref 5–40)

## 2020-05-30 LAB — HEPATIC FUNCTION PANEL
ALT: 8 IU/L (ref 0–32)
AST: 10 IU/L (ref 0–40)
Albumin: 4.1 g/dL (ref 3.8–4.8)
Alkaline Phosphatase: 70 IU/L (ref 48–121)
Bilirubin Total: 0.6 mg/dL (ref 0.0–1.2)
Bilirubin, Direct: 0.14 mg/dL (ref 0.00–0.40)
Total Protein: 6.6 g/dL (ref 6.0–8.5)

## 2020-06-01 ENCOUNTER — Other Ambulatory Visit: Payer: Self-pay | Admitting: Cardiology

## 2020-08-25 ENCOUNTER — Other Ambulatory Visit: Payer: Self-pay

## 2020-08-25 DIAGNOSIS — E119 Type 2 diabetes mellitus without complications: Secondary | ICD-10-CM | POA: Insufficient documentation

## 2020-08-25 DIAGNOSIS — R7303 Prediabetes: Secondary | ICD-10-CM | POA: Insufficient documentation

## 2020-08-28 ENCOUNTER — Other Ambulatory Visit: Payer: Self-pay

## 2020-08-28 ENCOUNTER — Encounter: Payer: Self-pay | Admitting: Cardiology

## 2020-08-28 ENCOUNTER — Ambulatory Visit: Payer: BC Managed Care – PPO | Admitting: Cardiology

## 2020-08-28 VITALS — BP 122/78 | HR 88 | Ht 64.0 in | Wt 184.0 lb

## 2020-08-28 DIAGNOSIS — E088 Diabetes mellitus due to underlying condition with unspecified complications: Secondary | ICD-10-CM | POA: Diagnosis not present

## 2020-08-28 DIAGNOSIS — R002 Palpitations: Secondary | ICD-10-CM

## 2020-08-28 DIAGNOSIS — I472 Ventricular tachycardia: Secondary | ICD-10-CM

## 2020-08-28 DIAGNOSIS — I4729 Other ventricular tachycardia: Secondary | ICD-10-CM

## 2020-08-28 NOTE — Progress Notes (Signed)
Cardiology Office Note:    Date:  08/28/2020   ID:  Cynthia [name], DOB 1976-03-11, MRN [id number]  PCP:  Maurice Small, MD  Cardiologist:  Jenean Lindau, MD   Referring MD: Maurice Small, MD    ASSESSMENT:    1. Nonsustained ventricular tachycardia ([place])   2. Diabetes mellitus due to underlying condition with unspecified complications ([place])   3. Palpitations    PLAN:    In order of problems listed above:  1. Primary prevention stressed with the patient.  Importance of compliance with diet medication stressed and she vocalized understanding.  Importance of regular exercise stressed.  Advised her that she needs to walk at least half an hour a day 5 days a week and she promises to do so. 2. Nonsustained ventricular tachycardia: Stable at this time.  Medical management.  Patient beta-blockers and no palpitations. 3. Diabetes mellitus: Managed by primary care physician.  Diet was emphasized.  Is doing well with this. 4. Mixed dyslipidemia: On statin therapy and tolerating well.  Last review of records revealed lipids to be unremarkable. 5. Patient will be seen in follow-up appointment in 6 months or earlier if the patient has any concerns    Medication Adjustments/Labs and Tests Ordered: Current medicines are reviewed at length with the patient today.  Concerns regarding medicines are outlined above.  No orders of the defined types were placed in this encounter.  No orders of the defined types were placed in this encounter.    No chief complaint on file.    History of Present Illness:    Cynthia [name] is a 44 y.o. female.  Patient has past medical history of nonsustained ventricular tachycardia, diabetes mellitus.  She denies any problems at this time and takes care of activities of daily living.  No chest pain orthopnea or PND.  At the time of my evaluation, the patient is alert awake oriented and in no distress. Past Medical History:  Diagnosis Date  . Diabetes mellitus due to  underlying condition with unspecified complications ([place]) 0/07/7352  . Diabetes mellitus without complication ([place])   . Head trauma   . Hyperprolactinemia ([place])   . Nonsustained ventricular tachycardia ([place]) 09/21/2018  . Palpitations 03/03/2018  . Pituitary tumor    is now resolved  . Prediabetes     Past Surgical History:  Procedure Laterality Date  . CESAREAN SECTION      Current Medications: Current Meds  Medication Sig  . atorvastatin (LIPITOR) 10 MG tablet Take 1 tablet by mouth once daily  . AVIANE 0.1-20 MG-MCG tablet 1 tablet daily.   . fexofenadine (ALLEGRA) 180 MG tablet Take 180 mg by mouth daily.  . meclizine (ANTIVERT) 25 MG tablet Take 25 mg by mouth 2 (two) times daily as needed.  . metFORMIN (GLUCOPHAGE-XR) 500 MG 24 hr tablet 500 mg 3 (three) times daily.   . metoprolol succinate (TOPROL-XL) 50 MG 24 hr tablet TAKE 1 TABLET BY MOUTH ONCE DAILY WITH A MEAL OR  IMMEDIATELY  FOLLOWING  A  MEAL  . omeprazole-sodium bicarbonate (ZEGERID) 40-1100 MG capsule Take 1 capsule by mouth daily before breakfast.     Allergies:   Azelastine, Montelukast sodium, Latex, and Penicillins   Social History   Socioeconomic History  . Marital status: Married    Spouse name: Not on file  . Number of children: Not on file  . Years of education: Not on file  . Highest education level: Not on file  Occupational History  .  Not on file  Tobacco Use  . Smoking status: Former Research scientist (life sciences)  . Smokeless tobacco: Never Used  Vaping Use  . Vaping Use: Never used  Substance and Sexual Activity  . Alcohol use: Not Currently    Comment: "very rarely"  . Drug use: Never  . Sexual activity: Not on file  Other Topics Concern  . Not on file  Social History Narrative  . Not on file   Social Determinants of Health   Financial Resource Strain:   . Difficulty of Paying Living Expenses: Not on file  Food Insecurity:   . Worried About Charity fundraiser in the Last Year: Not on file  . Ran  Out of Food in the Last Year: Not on file  Transportation Needs:   . Lack of Transportation (Medical): Not on file  . Lack of Transportation (Non-Medical): Not on file  Physical Activity:   . Days of Exercise per Week: Not on file  . Minutes of Exercise per Session: Not on file  Stress:   . Feeling of Stress : Not on file  Social Connections:   . Frequency of Communication with Friends and Family: Not on file  . Frequency of Social Gatherings with Friends and Family: Not on file  . Attends Religious Services: Not on file  . Active Member of Clubs or Organizations: Not on file  . Attends Archivist Meetings: Not on file  . Marital Status: Not on file     Family History: The patient's family history includes Breast cancer in her sister; Cervical cancer in her sister; Diabetes in her paternal grandfather; Hypertension in her mother; Lung cancer in her paternal grandfather.  ROS:   Please see the history of present illness.    All other systems reviewed and are negative.  EKGs/Labs/Other Studies Reviewed:    The following studies were reviewed today: Study Highlights  Cynthia [name], DOB 10-07-1976, MRN [id number]  EVENT MONITOR REPORT:   Patient was monitored from 02/17/2020 to 03/02/2020. Indication: Palpitations Ordering physician:  Jenean Lindau, MD  Referring physician:        Jenean Lindau, MD    Baseline rhythm: Sinus  Minimum heart rate: 52 BPM.  Average heart rate: 84 BPM.  Maximal heart rate 137 BPM.  Atrial arrhythmia: Rare PACs  Ventricular arrhythmia: None significant.  One ventricular triplet occasional PVCs  Conduction abnormality: None significant  Symptoms: None significant   Conclusion:  Mildly abnormal event monitor.  1 ventricular triplet otherwise unremarkable.  Interpreting  cardiologist: Jenean Lindau, MD  Date: 03/16/2020 11:56 AM     Recent Labs: 04/11/2020: BUN 14; Creatinine, Ser 0.67; Potassium 4.1; Sodium  141 05/29/2020: ALT 8  Recent Lipid Panel    Component Value Date/Time   CHOL 142 05/29/2020 1001   TRIG 89 05/29/2020 1001   HDL 47 05/29/2020 1001   CHOLHDL 3.0 05/29/2020 1001   LDLCALC 78 05/29/2020 1001    Physical Exam:    VS:  BP 122/78   Pulse 88   Ht 5\' 4"  (1.626 m)   Wt 184 lb (83.5 kg)   SpO2 98%   BMI 31.58 kg/m     Wt Readings from Last 3 Encounters:  08/28/20 184 lb (83.5 kg)  02/17/20 171 lb (77.6 kg)  10/13/19 177 lb (80.3 kg)     GEN: Patient is in no acute distress HEENT: Normal NECK: No JVD; No carotid bruits LYMPHATICS: No lymphadenopathy CARDIAC: Hear sounds regular, 2/6 systolic murmur  at the apex. RESPIRATORY:  Clear to auscultation without rales, wheezing or rhonchi  ABDOMEN: Soft, non-tender, non-distended MUSCULOSKELETAL:  No edema; No deformity  SKIN: Warm and dry NEUROLOGIC:  Alert and oriented x 3 PSYCHIATRIC:  Normal affect   Signed, Jenean Lindau, MD  08/28/2020 11:46 AM    [place]

## 2020-08-28 NOTE — Patient Instructions (Signed)

## 2020-09-06 ENCOUNTER — Other Ambulatory Visit: Payer: Self-pay | Admitting: Cardiology

## 2021-01-15 ENCOUNTER — Other Ambulatory Visit: Payer: Self-pay | Admitting: Family Medicine

## 2021-01-15 DIAGNOSIS — M542 Cervicalgia: Secondary | ICD-10-CM

## 2021-01-24 ENCOUNTER — Ambulatory Visit
Admission: RE | Admit: 2021-01-24 | Discharge: 2021-01-24 | Disposition: A | Discharge status: Home / Self Care | Payer: BC Managed Care – PPO | Source: Ambulatory Visit | Attending: Family Medicine | Admitting: Family Medicine

## 2021-01-24 DIAGNOSIS — M542 Cervicalgia: Secondary | ICD-10-CM

## 2021-02-10 ENCOUNTER — Other Ambulatory Visit: Payer: Self-pay | Admitting: Cardiology

## 2021-02-12 NOTE — Telephone Encounter (Signed)
Atorvastatin approved and sent 

## 2021-03-05 ENCOUNTER — Other Ambulatory Visit: Payer: Self-pay | Admitting: Cardiology

## 2021-03-06 ENCOUNTER — Other Ambulatory Visit: Payer: Self-pay

## 2021-03-06 DIAGNOSIS — D352 Benign neoplasm of pituitary gland: Secondary | ICD-10-CM

## 2021-03-06 DIAGNOSIS — G8929 Other chronic pain: Secondary | ICD-10-CM | POA: Insufficient documentation

## 2021-03-06 DIAGNOSIS — Z8632 Personal history of gestational diabetes: Secondary | ICD-10-CM | POA: Insufficient documentation

## 2021-03-06 DIAGNOSIS — E611 Iron deficiency: Secondary | ICD-10-CM

## 2021-03-06 DIAGNOSIS — R5382 Chronic fatigue, unspecified: Secondary | ICD-10-CM

## 2021-03-06 DIAGNOSIS — Z20828 Contact with and (suspected) exposure to other viral communicable diseases: Secondary | ICD-10-CM

## 2021-03-06 DIAGNOSIS — E669 Obesity, unspecified: Secondary | ICD-10-CM

## 2021-03-06 DIAGNOSIS — G9332 Myalgic encephalomyelitis/chronic fatigue syndrome: Secondary | ICD-10-CM

## 2021-03-06 DIAGNOSIS — J301 Allergic rhinitis due to pollen: Secondary | ICD-10-CM

## 2021-03-06 HISTORY — DX: Personal history of gestational diabetes: Z86.32

## 2021-03-06 HISTORY — DX: Other chronic pain: G89.29

## 2021-03-06 HISTORY — DX: Benign neoplasm of pituitary gland: D35.2

## 2021-03-06 HISTORY — DX: Chronic fatigue, unspecified: R53.82

## 2021-03-06 HISTORY — DX: Myalgic encephalomyelitis/chronic fatigue syndrome: G93.32

## 2021-03-06 HISTORY — DX: Allergic rhinitis due to pollen: J30.1

## 2021-03-06 HISTORY — DX: Obesity, unspecified: E66.9

## 2021-03-06 HISTORY — DX: Iron deficiency: E61.1

## 2021-03-06 HISTORY — DX: Contact with and (suspected) exposure to other viral communicable diseases: Z20.828

## 2021-03-13 ENCOUNTER — Ambulatory Visit: Payer: BC Managed Care – PPO | Admitting: Cardiology

## 2021-03-16 ENCOUNTER — Telehealth: Payer: Self-pay | Admitting: Hematology and Oncology

## 2021-03-16 NOTE — Telephone Encounter (Signed)
Received a new hem referral from Dr. Justin Mend for symptomatic anemia. Cynthia [name] has been cld and schedueld to see Dr. Chryl Heck at 5/10 at [employer]. Pt aware to arrive 20 minutes early.

## 2021-03-16 NOTE — Telephone Encounter (Signed)
Duplicate encounter

## 2021-03-20 ENCOUNTER — Encounter: Payer: Self-pay | Admitting: Hematology and Oncology

## 2021-03-20 ENCOUNTER — Other Ambulatory Visit: Payer: Self-pay

## 2021-03-20 ENCOUNTER — Inpatient Hospital Stay: Payer: BC Managed Care – PPO | Attending: Hematology and Oncology | Admitting: Hematology and Oncology

## 2021-03-20 ENCOUNTER — Inpatient Hospital Stay: Payer: BC Managed Care – PPO

## 2021-03-20 VITALS — BP 132/80 | HR 85 | Temp 97.6°F | Resp 18 | Ht 64.0 in | Wt 198.5 lb

## 2021-03-20 DIAGNOSIS — I472 Ventricular tachycardia: Secondary | ICD-10-CM | POA: Insufficient documentation

## 2021-03-20 DIAGNOSIS — R5382 Chronic fatigue, unspecified: Secondary | ICD-10-CM | POA: Insufficient documentation

## 2021-03-20 DIAGNOSIS — E611 Iron deficiency: Secondary | ICD-10-CM

## 2021-03-20 DIAGNOSIS — E221 Hyperprolactinemia: Secondary | ICD-10-CM | POA: Insufficient documentation

## 2021-03-20 DIAGNOSIS — E119 Type 2 diabetes mellitus without complications: Secondary | ICD-10-CM | POA: Diagnosis not present

## 2021-03-20 DIAGNOSIS — J301 Allergic rhinitis due to pollen: Secondary | ICD-10-CM | POA: Insufficient documentation

## 2021-03-20 DIAGNOSIS — Z800 Family history of malignant neoplasm of digestive organs: Secondary | ICD-10-CM | POA: Diagnosis not present

## 2021-03-20 DIAGNOSIS — Z803 Family history of malignant neoplasm of breast: Secondary | ICD-10-CM

## 2021-03-20 DIAGNOSIS — Z79899 Other long term (current) drug therapy: Secondary | ICD-10-CM | POA: Diagnosis not present

## 2021-03-20 DIAGNOSIS — D509 Iron deficiency anemia, unspecified: Secondary | ICD-10-CM | POA: Insufficient documentation

## 2021-03-20 DIAGNOSIS — R5383 Other fatigue: Secondary | ICD-10-CM | POA: Insufficient documentation

## 2021-03-20 DIAGNOSIS — Z7984 Long term (current) use of oral hypoglycemic drugs: Secondary | ICD-10-CM | POA: Diagnosis not present

## 2021-03-20 DIAGNOSIS — E669 Obesity, unspecified: Secondary | ICD-10-CM | POA: Diagnosis not present

## 2021-03-20 DIAGNOSIS — Z87891 Personal history of nicotine dependence: Secondary | ICD-10-CM | POA: Diagnosis not present

## 2021-03-20 NOTE — Assessment & Plan Note (Signed)
This is a very pleasant 45 year old female patient with no significant past medical history except for prolactinoma and prediabetes, iron deficiency referred to hematology for evaluation and recommendations regarding her iron deficiency anemia.  Ms. Pellicano arrived to the appointment today by herself.  She complains of fatigue which limits her from doing most of the stuff otherwise no complaints.  She has regular menstrual cycles which are light, no other blood loss per rectum or per urine.  She has been taking oral iron supplementation for the past 2 months, no improvement in ferritin. Physical examination, no significant findings. I reviewed her labs, ferritin of 3, hemoglobin over 10 g.  We have discussed 2 options for improvement in iron deficiency anemia including increasing the oral iron supplementation to thrice a day for 8 weeks and follow-up versus consideration of intravenous iron infusion.  She tells me because of lack of her energy, she would like to have some treatment which will give her a quicker response.  Hence we have discussed about IV iron.  We have discussed about Venofer, common side effects, small risk of allergy/anaphylaxis very rarely with intravenous iron infusion.  She is agreeable to proceed, 5 treatments of Venofer ordered.  She will return to clinic in 3 months with repeat labs. I have also encouraged her to schedule screening colonoscopy since she is over age 65, has severe iron deficiency and family history of colon cancer. Given her sister with breast cancer at the age of 8, recommended genetics referral, this has also been placed.  She is agreeable to all the recommendations.  All her questions were answered to the best of my knowledge.

## 2021-03-20 NOTE — Progress Notes (Signed)
[place] NOTE  Patient Care Team: Cynthia Small, [name]  CHIEF COMPLAINTS/PURPOSE OF CONSULTATION:  IDA, possible need for IV iron infusion  ASSESSMENT & PLAN:  Iron deficiency This is a very pleasant 45 year old Cynthia [name] patient with no significant past medical history except for prolactinoma and prediabetes, iron deficiency referred to hematology for evaluation and recommendations regarding her iron deficiency anemia.  Cynthia Cynthia [name] arrived to the appointment today by herself.  She complains of fatigue which limits her from doing most of the stuff otherwise no complaints.  She has regular menstrual cycles which are light, no other blood loss per rectum or per urine.  She has been taking oral iron supplementation for the past 2 months, no improvement in ferritin. Physical examination, no significant findings. I reviewed her labs, ferritin of 3, hemoglobin over 10 g.  We have discussed 2 options for improvement in iron deficiency anemia including increasing the oral iron supplementation to thrice a day for 8 weeks and follow-up versus consideration of intravenous iron infusion.  She tells me because of lack of her energy, she would like to have some treatment which will give her a quicker response.  Hence we have discussed about IV iron.  We have discussed about Venofer, common side effects, [name] risk of allergy/anaphylaxis very rarely with intravenous iron infusion.  She is agreeable to proceed, 5 treatments of Venofer ordered.  She will return to clinic in 3 months with repeat labs. I have also encouraged her to schedule screening colonoscopy since she is over age 50, has severe iron deficiency and family history of colon cancer. Given her sister with breast cancer at the age of 12, recommended genetics referral, this has also been placed.  She is agreeable to all the recommendations.  All her questions were answered to the best of my  knowledge.   Orders Placed This Encounter  Procedures  . Ambulatory referral to Genetics    Referral Priority:   Routine    Referral Type:   Consultation    Referral Reason:   Specialty Services Required    Number of Visits Requested:   1    HISTORY OF PRESENTING ILLNESS:   Cynthia Cynthia [name] 45 y.o. Cynthia [name] is here because of IDA  This is a very pleasant 45 year old Cynthia [name] patient with past medical history significant for prediabetes, prolactinoma, chronic fatigue referred to hematology for evaluation and recommendations for iron deficiency. Cynthia Cynthia [name] is here for an initial visit.  She complains of fatigue, she wakes up and does not feel like she has much energy to do anything.  Besides fatigue, she denies any pica, brittle nails, hair loss, difficulty swallowing, shortness of breath on exertion.  She denies any blood loss in her stool or in her urine.  Menstrual cycles regular, she describes them as light.  She is on birth control.  She does have family history of colon cancer in her paternal grandfather.  She has been taking oral iron once a day for the past 2 months.  No improvement in fatigue.  Rest of the pertinent 10 point ROS reviewed and negative.  REVIEW OF SYSTEMS:   Constitutional: Denies fevers, chills or abnormal night sweats Eyes: Denies blurriness of vision, double vision or watery eyes Ears, nose, mouth, throat, and face: Denies mucositis or sore throat Respiratory: Denies cough, dyspnea or wheezes Cardiovascular: Denies palpitation, chest discomfort or lower extremity swelling Gastrointestinal:  Denies nausea, heartburn or change in bowel habits Skin:  Denies abnormal skin rashes Lymphatics: Denies new lymphadenopathy or easy bruising Neurological:Denies numbness, tingling or new weaknesses Behavioral/Psych: Mood is stable, no new changes  All other systems were reviewed with the patient and are negative.  MEDICAL HISTORY:  Past Medical History:  Diagnosis Date  . Allergic  rhinitis due to pollen 03/06/2021  . Chronic fatigue syndrome 03/06/2021  . Chronic pain 03/06/2021  . Contact with and (suspected) exposure to other viral communicable diseases 03/06/2021  . Diabetes mellitus due to underlying condition with unspecified complications ([facility]) 03/16/3874  . Diabetes mellitus without complication ([facility])   . Head trauma   . History of gestational diabetes mellitus 03/06/2021  . Hyperprolactinemia ([facility])   . Iron deficiency 03/06/2021  . Nonsustained ventricular tachycardia ([facility]) 09/21/2018  . Obesity 03/06/2021  . Palpitations 03/03/2018  . Pituitary tumor    is now resolved  . Prediabetes   . Prolactinoma ([facility]) 03/06/2021    SURGICAL HISTORY: Past Surgical History:  Procedure Laterality Date  . CESAREAN SECTION      SOCIAL HISTORY: Social History   Socioeconomic History  . Marital status: Married    Spouse name: Not on file  . Number of children: Not on file  . Years of education: Not on file  . Highest education level: Not on file  Occupational History  . Not on file  Tobacco Use  . Smoking status: Former Research scientist (life sciences)  . Smokeless tobacco: Never Used  Vaping Use  . Vaping Use: Never used  Substance and Sexual Activity  . Alcohol use: Not Currently    Comment: "very rarely"  . Drug use: Never  . Sexual activity: Not on file  Other Topics Concern  . Not on file  Social History Narrative  . Not on file   Social Determinants of Health   Financial Resource Strain: Not on file  Food Insecurity: Not on file  Transportation Needs: Not on file  Physical Activity: Not on file  Stress: Not on file  Social Connections: Not on file  Intimate Partner Violence: Not on file    FAMILY HISTORY: Family History  Problem Relation Age of Onset  . Hypertension Mother   . Cervical cancer Sister   . Breast cancer Sister   . Diabetes Paternal Grandfather   . Lung cancer Paternal Grandfather     ALLERGIES:  is allergic to amoxicillin-pot clavulanate,  azelastine, azelastine hcl, montelukast sodium, latex, and penicillins.  MEDICATIONS:  Current Outpatient Medications  Medication Sig Dispense Refill  . atorvastatin (LIPITOR) 10 MG tablet Take 1 tablet by mouth once daily 90 tablet 0  . Ferrous Sulfate (IRON) 325 (65 Fe) MG TABS Take 1 tablet by mouth daily.    . fexofenadine (ALLEGRA) 180 MG tablet Take 180 mg by mouth daily.    Marland Kitchen levonorgestrel-ethinyl estradiol (ALESSE) 0.1-20 MG-MCG tablet Take 1 tablet by mouth daily at 12 noon.    . meclizine (ANTIVERT) 25 MG tablet Take 25 mg by mouth 2 (two) times daily as needed for dizziness.    . metFORMIN (GLUCOPHAGE) 500 MG tablet Take 500 mg by mouth 3 (three) times daily.    . metoprolol succinate (TOPROL-XL) 50 MG 24 hr tablet Take 50 mg by mouth daily. Take with or immediately following a meal.    . Multiple Vitamins-Minerals (MULTIVITAMIN/EXTRA VITAMIN D3) CHEW Chew 1 Dose by mouth daily.    Marland Kitchen omeprazole-sodium bicarbonate (ZEGERID) 40-1100 MG capsule Take 1 capsule by mouth daily before breakfast.    . triamcinolone ointment (KENALOG) 0.1 %  Apply 1 application topically in the morning and at bedtime.     No current facility-administered medications for this visit.    PHYSICAL EXAMINATION:  ECOG PERFORMANCE STATUS: 1 - Symptomatic but completely ambulatory  Vitals:   03/20/21 1011  BP: 132/80  Pulse: 85  Resp: 18  Temp: 97.6 F (36.4 C)  SpO2: 100%   Filed Weights   03/20/21 1011  Weight: 198 lb 8 oz (90 kg)    GENERAL:alert, no distress and comfortable SKIN: skin color, texture, turgor are normal, no rashes or significant lesions EYES: normal, conjunctiva are pink and non-injected, sclera clear OROPHARYNX:no exudate, no erythema and lips, buccal mucosa, and tongue normal  NECK: supple, thyroid normal size, non-tender, without nodularity LYMPH:  no palpable lymphadenopathy in the cervical, axillary or inguinal LUNGS: clear to auscultation and percussion with normal  breathing effort HEART: regular rate & rhythm and no murmurs and no lower extremity edema ABDOMEN:abdomen soft, non-tender and normal bowel sounds Musculoskeletal:no cyanosis of digits and no clubbing  PSYCH: alert & oriented x 3 with fluent speech NEURO: no focal motor/sensory deficits  LABORATORY DATA:  I have reviewed the data as listed Lab Results  Component Value Date   WBC 8.2 03/27/2018   HGB 12.8 03/27/2018   HCT 38.7 03/27/2018   MCV 89 03/27/2018   PLT 230 03/27/2018     Chemistry      Component Value Date/Time   NA 141 04/11/2020 0932   K 4.1 04/11/2020 0932   CL 106 04/11/2020 0932   CO2 22 04/11/2020 0932   BUN 14 04/11/2020 0932   CREATININE 0.67 04/11/2020 0932      Component Value Date/Time   CALCIUM 8.9 04/11/2020 0932   ALKPHOS 70 05/29/2020 1001   AST 10 05/29/2020 1001   ALT 8 05/29/2020 1001   BILITOT 0.6 05/29/2020 1001     Ferritin is 3.0 B12 201 CBC showed WBC 7.8, Hb 10.9, PLT 289 Hb A1c 6.2 TSH is normal Creatinine is normal.  RADIOGRAPHIC STUDIES: I have personally reviewed the radiological images as listed and agreed with the findings in the report. No results found.  All questions were answered. The patient knows to call the clinic with any problems, questions or concerns. I spent 35 minutes in the care of this patient including H and P, review of records, counseling and coordination of care.     Benay Pike, MD 03/20/2021 11:32 AM

## 2021-03-21 ENCOUNTER — Telehealth: Payer: Self-pay | Admitting: Genetic Counselor

## 2021-03-21 NOTE — Telephone Encounter (Signed)
Scheduled appt per 5/10 sch msg. Pt aware.  

## 2021-03-26 ENCOUNTER — Other Ambulatory Visit: Payer: Self-pay

## 2021-03-26 ENCOUNTER — Inpatient Hospital Stay: Payer: BC Managed Care – PPO

## 2021-03-26 ENCOUNTER — Other Ambulatory Visit: Payer: Self-pay | Admitting: Hematology and Oncology

## 2021-03-26 VITALS — BP 133/77 | HR 63 | Temp 98.5°F | Resp 20

## 2021-03-26 DIAGNOSIS — D509 Iron deficiency anemia, unspecified: Secondary | ICD-10-CM | POA: Diagnosis not present

## 2021-03-26 DIAGNOSIS — E611 Iron deficiency: Secondary | ICD-10-CM

## 2021-03-26 DIAGNOSIS — Z803 Family history of malignant neoplasm of breast: Secondary | ICD-10-CM

## 2021-03-26 MED ORDER — ACETAMINOPHEN 325 MG PO TABS
650.0000 mg | ORAL_TABLET | Freq: Once | ORAL | Status: AC
  Administered 2021-03-26: 650 mg via ORAL

## 2021-03-26 MED ORDER — SODIUM CHLORIDE 0.9 % IV SOLN
200.0000 mg | Freq: Once | INTRAVENOUS | Status: AC
  Administered 2021-03-26: 200 mg via INTRAVENOUS
  Filled 2021-03-26: qty 200

## 2021-03-26 MED ORDER — ACETAMINOPHEN 325 MG PO TABS
ORAL_TABLET | ORAL | Status: AC
  Filled 2021-03-26: qty 2

## 2021-03-26 MED ORDER — DIPHENHYDRAMINE HCL 25 MG PO CAPS
ORAL_CAPSULE | ORAL | Status: AC
  Filled 2021-03-26: qty 1

## 2021-03-26 MED ORDER — DIPHENHYDRAMINE HCL 25 MG PO CAPS
25.0000 mg | ORAL_CAPSULE | Freq: Once | ORAL | Status: AC
  Administered 2021-03-26: 25 mg via ORAL

## 2021-03-26 MED ORDER — SODIUM CHLORIDE 0.9 % IV SOLN
Freq: Once | INTRAVENOUS | Status: AC
  Filled 2021-03-26: qty 250

## 2021-03-26 NOTE — Progress Notes (Signed)
1308-Pt complaining of "not feeling right" and "heaviness to head."  Venofer stopped and NS started. VS stable. 1318-Pt states that "heaviness" has dissipated at this time. Dr. Chryl Heck notified and order received for pt to take Tylenol 650mg  PO and Benadryl 25 mg PO now and to restart Venofer.

## 2021-03-26 NOTE — Patient Instructions (Signed)

## 2021-03-26 NOTE — Progress Notes (Signed)
1403-Pt without complaints at this time.  Venofer restarted.

## 2021-03-29 ENCOUNTER — Encounter: Payer: BC Managed Care – PPO | Admitting: Genetic Counselor

## 2021-03-29 ENCOUNTER — Other Ambulatory Visit: Payer: BC Managed Care – PPO

## 2021-03-30 ENCOUNTER — Inpatient Hospital Stay: Payer: BC Managed Care – PPO

## 2021-03-30 VITALS — BP 111/60 | HR 75 | Temp 98.2°F | Resp 16

## 2021-03-30 DIAGNOSIS — D509 Iron deficiency anemia, unspecified: Secondary | ICD-10-CM | POA: Diagnosis not present

## 2021-03-30 DIAGNOSIS — E611 Iron deficiency: Secondary | ICD-10-CM

## 2021-03-30 MED ORDER — SODIUM CHLORIDE 0.9 % IV SOLN
Freq: Once | INTRAVENOUS | Status: AC
  Filled 2021-03-30: qty 250

## 2021-03-30 MED ORDER — DIPHENHYDRAMINE HCL 25 MG PO CAPS
ORAL_CAPSULE | ORAL | Status: AC
  Filled 2021-03-30: qty 1

## 2021-03-30 MED ORDER — ACETAMINOPHEN 325 MG PO TABS
650.0000 mg | ORAL_TABLET | Freq: Once | ORAL | Status: AC
  Administered 2021-03-30: 650 mg via ORAL

## 2021-03-30 MED ORDER — ACETAMINOPHEN 325 MG PO TABS
ORAL_TABLET | ORAL | Status: AC
  Filled 2021-03-30: qty 2

## 2021-03-30 MED ORDER — DIPHENHYDRAMINE HCL 25 MG PO CAPS
25.0000 mg | ORAL_CAPSULE | Freq: Once | ORAL | Status: AC
  Administered 2021-03-30: 25 mg via ORAL

## 2021-03-30 MED ORDER — SODIUM CHLORIDE 0.9 % IV SOLN
200.0000 mg | Freq: Once | INTRAVENOUS | Status: AC
  Administered 2021-03-30: 200 mg via INTRAVENOUS
  Filled 2021-03-30: qty 200

## 2021-03-30 NOTE — Patient Instructions (Signed)

## 2021-04-04 ENCOUNTER — Encounter: Payer: Self-pay | Admitting: Genetic Counselor

## 2021-04-04 ENCOUNTER — Other Ambulatory Visit: Payer: Self-pay

## 2021-04-04 ENCOUNTER — Inpatient Hospital Stay (HOSPITAL_BASED_OUTPATIENT_CLINIC_OR_DEPARTMENT_OTHER): Payer: BC Managed Care – PPO | Admitting: Genetic Counselor

## 2021-04-04 ENCOUNTER — Inpatient Hospital Stay: Payer: BC Managed Care – PPO

## 2021-04-04 DIAGNOSIS — Z803 Family history of malignant neoplasm of breast: Secondary | ICD-10-CM | POA: Diagnosis not present

## 2021-04-04 HISTORY — DX: Family history of malignant neoplasm of breast: Z80.3

## 2021-04-04 LAB — GENETIC SCREENING ORDER

## 2021-04-04 NOTE — Progress Notes (Signed)
REFERRING PROVIDER: Benay Pike, MD [place],  [place] [address]  PRIMARY PROVIDER:  Maurice Small, MD  PRIMARY REASON FOR VISIT:  1. Family history of breast cancer      HISTORY OF PRESENT ILLNESS:   Ms. Hereford, a 45 y.o. female, was seen for a [facility] cancer genetics consultation at the request of Dr. Chryl Heck due to a family history of breast cancer.  Ms. Song presents to clinic today to discuss the possibility of a hereditary predisposition to cancer, genetic testing, and to further clarify her future cancer risks, as well as potential cancer risks for family members.   Ms. Tagliaferri is a 45 y.o. female with no personal history of cancer.    CANCER HISTORY:  Oncology History   No history exists.     RISK FACTORS:  Menarche was at age 89.  First live birth at age 73.  OCP use for approximately 20+ years.  Ovaries intact: yes.  Hysterectomy: no.  Menopausal status: premenopausal.  HRT use: 0 years. Colonoscopy: no; not examined. Mammogram within the last year: yes. Number of breast biopsies: 0. Up to date with pelvic exams: yes. Any excessive radiation exposure in the past: no  Past Medical History:  Diagnosis Date  . Allergic rhinitis due to pollen 03/06/2021  . Chronic fatigue syndrome 03/06/2021  . Chronic pain 03/06/2021  . Contact with and (suspected) exposure to other viral communicable diseases 03/06/2021  . Diabetes mellitus due to underlying condition with unspecified complications ([facility]) 03/16/2129  . Diabetes mellitus without complication ([facility])   . Family history of breast cancer 04/04/2021  . Head trauma   . History of gestational diabetes mellitus 03/06/2021  . Hyperprolactinemia ([facility])   . Iron deficiency 03/06/2021  . Nonsustained ventricular tachycardia ([facility]) 09/21/2018  . Obesity 03/06/2021  . Palpitations 03/03/2018  . Pituitary tumor    is now resolved  . Prediabetes   . Prolactinoma ([facility]) 03/06/2021    Past Surgical History:  Procedure  Laterality Date  . CESAREAN SECTION      Social History   Socioeconomic History  . Marital status: Married    Spouse name: Not on file  . Number of children: Not on file  . Years of education: Not on file  . Highest education level: Not on file  Occupational History  . Not on file  Tobacco Use  . Smoking status: Former Research scientist (life sciences)  . Smokeless tobacco: Never Used  Vaping Use  . Vaping Use: Never used  Substance and Sexual Activity  . Alcohol use: Not Currently    Comment: "very rarely"  . Drug use: Never  . Sexual activity: Not on file  Other Topics Concern  . Not on file  Social History Narrative  . Not on file   Social Determinants of Health   Financial Resource Strain: Not on file  Food Insecurity: Not on file  Transportation Needs: Not on file  Physical Activity: Not on file  Stress: Not on file  Social Connections: Not on file     FAMILY HISTORY:  We obtained a detailed, 4-generation family history.  Significant diagnoses are listed below: Family History  Problem Relation Age of Onset  . Hypertension Mother   . Cervical cancer Sister 66  . Breast cancer Sister 58  . Diabetes Paternal Grandfather   . Lung cancer Paternal Grandfather   . Dementia Maternal Grandmother     The patient has three children who are all cancer free. She has a brother and  sister.  Her sister was diagnosed with both breast and cervical cancer at age 97.  She is unavailable for testing at this time.  Both parents are living.  The patient's mother has not had cancer.  She has a brother who is cancer free.  Both maternal grandparents are deceased from non-cancer related issues.  The patient's father has a brother and sister who are cancer free.  His parents are deceased.  His father was diagnosed with lung cancer at 4 and he died at 32 from non cancer related issues.  His mother died at 66 from non cancer related issues.  Ms. Treptow is unaware of previous family history of genetic testing  for hereditary cancer risks. Patient's maternal ancestors are of [place] descent, and paternal ancestors are of [place] and [place] descent. There is no reported Ashkenazi Jewish ancestry. There is no known consanguinity.  GENETIC COUNSELING ASSESSMENT: Ms. Lefebre is a 45 y.o. female with a family history of breast cancer which is somewhat suggestive of a hereditary cancer syndrome and predisposition to cancer given the young age of onset. We, therefore, discussed and recommended the following at today's visit.   DISCUSSION: We discussed that 5 - 10% of breast cancer is hereditary, with most cases associated with BRCA mutations.  There are other genes that can be associated with hereditary breast cancer syndromes.  These include ATM, CHEK2 and PALB2.  We discussed that testing is beneficial for several reasons including knowing how to follow individuals and understand if other family members could be at risk for cancer and allow them to undergo genetic testing.   We reviewed the characteristics, features and inheritance patterns of hereditary cancer syndromes. We also discussed genetic testing, including the appropriate family members to test, the process of testing, insurance coverage and turn-around-time for results. We discussed the implications of a negative, positive, carrier and/or variant of uncertain significant result. We recommended Ms. Virtue pursue genetic testing for the CancerNext-Expanded+RNAinsight gene panel. The CancerNext-Expanded gene panel offered by [facility] and includes sequencing and rearrangement analysis for the following 77 genes: AIP, ALK, APC*, ATM*, AXIN2, BAP1, BARD1, BLM, BMPR1A, BRCA1*, BRCA2*, BRIP1*, CDC73, CDH1*, CDK4, CDKN1B, CDKN2A, CHEK2*, CTNNA1, DICER1, FANCC, FH, FLCN, GALNT12, KIF1B, LZTR1, MAX, MEN1, MET, MLH1*, MSH2*, MSH3, MSH6*, MUTYH*, NBN, NF1*, NF2, NTHL1, PALB2*, PHOX2B, PMS2*, POT1, PRKAR1A, PTCH1, PTEN*, RAD51C*, RAD51D*, RB1, RECQL, RET, SDHA, SDHAF2,  SDHB, SDHC, SDHD, SMAD4, SMARCA4, SMARCB1, SMARCE1, STK11, SUFU, TMEM127, TP53*, TSC1, TSC2, VHL and XRCC2 (sequencing and deletion/duplication); EGFR, EGLN1, HOXB13, KIT, MITF, PDGFRA, POLD1, and POLE (sequencing only); EPCAM and GREM1 (deletion/duplication only). DNA and RNA analyses performed for * genes.   Based on Ms. Beste's family history of cancer, she meets medical criteria for genetic testing. Despite that she meets criteria, she may still have an out of pocket cost. We discussed that if her out of pocket cost for testing is over $100, the laboratory will call and confirm whether she wants to proceed with testing.  If the out of pocket cost of testing is less than $100 she will be billed by the genetic testing laboratory.   Based on the patient's family history, a statistical model (Tyrer Cusik) was used to estimate her risk of developing breast cancer. This estimates her lifetime risk of developing breast cancer to be approximately 18.6%. This estimation does not consider any genetic testing results.  The patient's lifetime breast cancer risk is a preliminary estimate based on available information using one of several models endorsed by the  American Cancer Society (ACS). The ACS recommends consideration of breast MRI screening as an adjunct to mammography for patients at high risk (defined as 20% or greater lifetime risk). Please note that a woman's breast cancer risk changes over time. It may increase or decrease based on age and any changes to the personal and/or family medical history. The risks and recommendations listed above apply to this patient at this point in time. In the future, she may or may not be eligible for the same medical management strategies and, in some cases, other medical management strategies may become available to her. If she is interested in an updated breast cancer risk assessment at a later date, she can contact us.   PLAN: After considering the risks, benefits, and  limitations, Ms. Bhatt provided informed consent to pursue genetic testing and the blood sample was sent to [facility] for analysis of the CancerNext-Expanded+RNAinsight. Results should be available within approximately 2-3 weeks' time, at which point they will be disclosed by telephone to Ms. Balandran, as will any additional recommendations warranted by these results. Ms. Novoa will receive a summary of her genetic counseling visit and a copy of her results once available. This information will also be available in Epic.   Lastly, we encouraged Ms. Walz to remain in contact with cancer genetics annually so that we can continuously update the family history and inform her of any changes in cancer genetics and testing that may be of benefit for this family.   Ms. Dix questions were answered to her satisfaction today. Our contact information was provided should additional questions or concerns arise. Thank you for the referral and allowing [place] to share in the care of your patient.   Renelda Kilian P. Florene Glen, [place], [facility] Licensed, Insurance risk surveyor [email or web address] phone: [phone]  The patient was seen for a total of 30 minutes in face-to-face genetic counseling.  This patient was discussed with Drs. Magrinat, Lindi Adie and/or Burr Medico who agrees with the above.    _______________________________________________________________________ For Office Staff:  Number of people involved in session: 1 Was an Intern/ student involved with case: no

## 2021-04-06 ENCOUNTER — Other Ambulatory Visit: Payer: Self-pay

## 2021-04-06 ENCOUNTER — Inpatient Hospital Stay: Payer: BC Managed Care – PPO

## 2021-04-06 VITALS — BP 120/64 | HR 63 | Temp 98.1°F | Resp 18

## 2021-04-06 DIAGNOSIS — E611 Iron deficiency: Secondary | ICD-10-CM

## 2021-04-06 DIAGNOSIS — D509 Iron deficiency anemia, unspecified: Secondary | ICD-10-CM | POA: Diagnosis not present

## 2021-04-06 MED ORDER — SODIUM CHLORIDE 0.9 % IV SOLN
Freq: Once | INTRAVENOUS | Status: AC
  Filled 2021-04-06: qty 250

## 2021-04-06 MED ORDER — IRON SUCROSE 20 MG/ML IV SOLN
200.0000 mg | Freq: Once | INTRAVENOUS | Status: AC
  Administered 2021-04-06: 200 mg via INTRAVENOUS
  Filled 2021-04-06: qty 200

## 2021-04-06 NOTE — Patient Instructions (Signed)

## 2021-04-11 ENCOUNTER — Telehealth: Payer: Self-pay | Admitting: Hematology and Oncology

## 2021-04-11 NOTE — Telephone Encounter (Signed)
Scheduled appt per 6/1 sch msg. Called pt, no answer. Left msg with appt date and time.  

## 2021-04-12 ENCOUNTER — Inpatient Hospital Stay: Payer: BC Managed Care – PPO | Attending: Hematology and Oncology

## 2021-04-12 ENCOUNTER — Other Ambulatory Visit: Payer: Self-pay

## 2021-04-12 VITALS — BP 114/66 | HR 77 | Temp 97.7°F | Resp 16

## 2021-04-12 DIAGNOSIS — D509 Iron deficiency anemia, unspecified: Secondary | ICD-10-CM | POA: Diagnosis present

## 2021-04-12 DIAGNOSIS — E611 Iron deficiency: Secondary | ICD-10-CM

## 2021-04-12 MED ORDER — SODIUM CHLORIDE 0.9 % IV SOLN
Freq: Once | INTRAVENOUS | Status: AC
  Filled 2021-04-12: qty 250

## 2021-04-12 MED ORDER — SODIUM CHLORIDE 0.9 % IV SOLN
200.0000 mg | Freq: Once | INTRAVENOUS | Status: AC
  Administered 2021-04-12: 200 mg via INTRAVENOUS
  Filled 2021-04-12: qty 200

## 2021-04-12 NOTE — Patient Instructions (Signed)

## 2021-04-13 ENCOUNTER — Ambulatory Visit: Payer: BC Managed Care – PPO

## 2021-04-13 ENCOUNTER — Ambulatory Visit: Payer: BC Managed Care – PPO | Admitting: Cardiology

## 2021-04-13 ENCOUNTER — Encounter: Payer: Self-pay | Admitting: Cardiology

## 2021-04-13 VITALS — BP 120/88 | HR 94 | Ht 64.0 in | Wt 197.0 lb

## 2021-04-13 DIAGNOSIS — E088 Diabetes mellitus due to underlying condition with unspecified complications: Secondary | ICD-10-CM

## 2021-04-13 DIAGNOSIS — E66811 Obesity, class 1: Secondary | ICD-10-CM

## 2021-04-13 DIAGNOSIS — E669 Obesity, unspecified: Secondary | ICD-10-CM

## 2021-04-13 DIAGNOSIS — D509 Iron deficiency anemia, unspecified: Secondary | ICD-10-CM

## 2021-04-13 DIAGNOSIS — I472 Ventricular tachycardia: Secondary | ICD-10-CM | POA: Diagnosis not present

## 2021-04-13 DIAGNOSIS — I4729 Other ventricular tachycardia: Secondary | ICD-10-CM

## 2021-04-13 HISTORY — DX: Obesity, class 1: E66.811

## 2021-04-13 HISTORY — DX: Obesity, unspecified: E66.9

## 2021-04-13 HISTORY — DX: Iron deficiency anemia, unspecified: D50.9

## 2021-04-13 NOTE — Progress Notes (Signed)
Cardiology Office Note:    Date:  04/13/2021   ID:  Cynthia [name], DOB 04/10/1976, MRN [id number]  PCP:  Maurice Small, MD  Cardiologist:  Jenean Lindau, MD   Referring MD: Maurice Small, MD    ASSESSMENT:    1. Nonsustained ventricular tachycardia ([place])   2. Diabetes mellitus due to underlying condition with unspecified complications ([place])   3. Obesity (BMI 30.0-34.9)    PLAN:    In order of problems listed above:  1. Primary prevention stressed with the patient.  Importance of compliance medication stressed and she vocalized understanding.  She was advised to walk at least half an hour a day 5 days a week and she promises to do so. 2. Diabetes mellitus: Diet emphasized.  Hemoglobin A1c is mildly elevated.  She was advised about diet and exercise and lifestyle modification and she promises to do better. 3. Nonsustained ventricular tachycardia: Stable asymptomatic and on beta-blockers.  Medical management and will continue to monitor. 4. Obesity: Weight reduction stressed.  Risks of obesity emphasized and patient plans to do the needful and doing the needful and reducing weight and exercising. 5. Patient will be seen in follow-up appointment in 6 months or earlier if the patient has any concerns    Medication Adjustments/Labs and Tests Ordered: Current medicines are reviewed at length with the patient today.  Concerns regarding medicines are outlined above.  No orders of the defined types were placed in this encounter.  No orders of the defined types were placed in this encounter.    No chief complaint on file.    History of Present Illness:    Cynthia [name] is a 45 y.o. female.  Patient has past medical history of nonsustained ventricular tachycardia, essential hypertension dyslipidemia and diabetes mellitus.  She is obese.  She leads a sedentary lifestyle.  She denies any chest pain orthopnea or PND.  She is does not exercise on a regular basis.  At the time of my evaluation,  the patient is alert awake oriented and in no distress.  Past Medical History:  Diagnosis Date  . Allergic rhinitis due to pollen 03/06/2021  . Chronic fatigue syndrome 03/06/2021  . Chronic pain 03/06/2021  . Contact with and (suspected) exposure to other viral communicable diseases 03/06/2021  . Diabetes mellitus due to underlying condition with unspecified complications ([place]) 12/13/1939  . Diabetes mellitus without complication ([place])   . Family history of breast cancer 04/04/2021  . Head trauma   . History of gestational diabetes mellitus 03/06/2021  . Hyperprolactinemia ([place])   . Iron deficiency 03/06/2021  . Nonsustained ventricular tachycardia ([place]) 09/21/2018  . Obesity 03/06/2021  . Palpitations 03/03/2018  . Pituitary tumor    is now resolved  . Prediabetes   . 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 CESAREAN SECTION      Current Medications: Current Meds  Medication Sig  . atorvastatin (LIPITOR) 10 MG tablet Take 1 tablet by mouth once daily  . cyanocobalamin 1000 MCG tablet Take 1,000 mcg by mouth daily.  . Ferrous Sulfate (IRON) 325 (65 Fe) MG TABS Take 1 tablet by mouth daily.  . fexofenadine (ALLEGRA) 180 MG tablet Take 180 mg by mouth daily.  Marland Kitchen levonorgestrel-ethinyl estradiol (ALESSE) 0.1-20 MG-MCG tablet Take 1 tablet by mouth daily at 12 noon.  . meclizine (ANTIVERT) 25 MG tablet Take 25 mg by mouth 2 (two) times daily as needed for dizziness.  . metFORMIN (GLUCOPHAGE)  500 MG tablet Take 500 mg by mouth 3 (three) times daily.  . metoprolol succinate (TOPROL-XL) 50 MG 24 hr tablet Take 50 mg by mouth daily. Take with or immediately following a meal.  . Multiple Vitamins-Minerals (MULTIVITAMIN/EXTRA VITAMIN D3) CHEW Chew 1 Dose by mouth daily.  Marland Kitchen omeprazole-sodium bicarbonate (ZEGERID) 40-1100 MG capsule Take 1 capsule by mouth daily before breakfast.  . triamcinolone ointment (KENALOG) 0.1 % Apply 1 application topically in  the morning and at bedtime.     Allergies:   Amoxicillin-pot clavulanate, Azelastine, Azelastine hcl, Montelukast sodium, Latex, and Penicillins   Social History   Socioeconomic History  . Marital status: Married    Spouse name: Not on file  . Number of children: Not on file  . Years of education: Not on file  . Highest education level: Not on file  Occupational History  . Not on file  Tobacco Use  . Smoking status: Former Research scientist (life sciences)  . Smokeless tobacco: Never Used  Vaping Use  . Vaping Use: Never used  Substance and Sexual Activity  . Alcohol use: Not Currently    Comment: "very rarely"  . Drug use: Never  . Sexual activity: Not on file  Other Topics Concern  . Not on file  Social History Narrative  . Not on file   Social Determinants of Health   Financial Resource Strain: Not on file  Food Insecurity: Not on file  Transportation Needs: Not on file  Physical Activity: Not on file  Stress: Not on file  Social Connections: Not on file     Family History: The patient's family history includes Breast cancer (age of onset: 37) in her sister; Cervical cancer (age of onset: 23) in her sister; Dementia in her maternal grandmother; Diabetes in her paternal grandfather; Hypertension in her mother; Lung cancer in her paternal grandfather.  ROS:   Please see the history of present illness.    All other systems reviewed and are negative.  EKGs/Labs/Other Studies Reviewed:    The following studies were reviewed today: I discussed my findings with the patient at extensive length.   Recent Labs: 05/29/2020: ALT 8  Recent Lipid Panel    Component Value Date/Time   CHOL 142 05/29/2020 1001   TRIG 89 05/29/2020 1001   HDL 47 05/29/2020 1001   CHOLHDL 3.0 05/29/2020 1001   LDLCALC 78 05/29/2020 1001    Physical Exam:    VS:  BP 120/88   Pulse 94   Ht 5\' 4"  (1.626 m)   Wt 197 lb (89.4 kg)   SpO2 98%   BMI 33.81 kg/m     Wt Readings from Last 3 Encounters:  04/13/21  197 lb (89.4 kg)  03/20/21 198 lb 8 oz (90 kg)  08/28/20 184 lb (83.5 kg)     GEN: Patient is in no acute distress HEENT: Normal NECK: No JVD; No carotid bruits LYMPHATICS: No lymphadenopathy CARDIAC: Hear sounds regular, 2/6 systolic murmur at the apex. RESPIRATORY:  Clear to auscultation without rales, wheezing or rhonchi  ABDOMEN: Soft, non-tender, non-distended MUSCULOSKELETAL:  No edema; No deformity  SKIN: Warm and dry NEUROLOGIC:  Alert and oriented x 3 PSYCHIATRIC:  Normal affect   Signed, Jenean Lindau, MD  04/13/2021 10:58 AM    [place]

## 2021-04-13 NOTE — Patient Instructions (Signed)

## 2021-04-17 ENCOUNTER — Encounter: Payer: Self-pay | Admitting: Genetic Counselor

## 2021-04-17 ENCOUNTER — Telehealth: Payer: Self-pay | Admitting: Genetic Counselor

## 2021-04-17 DIAGNOSIS — Z1379 Encounter for other screening for genetic and chromosomal anomalies: Secondary | ICD-10-CM

## 2021-04-17 HISTORY — DX: Encounter for other screening for genetic and chromosomal anomalies: Z13.79

## 2021-04-17 NOTE — Telephone Encounter (Signed)
LM on VM that results were back and to please call.  Left CB instructions. 

## 2021-04-18 ENCOUNTER — Ambulatory Visit: Payer: Self-pay | Admitting: Genetic Counselor

## 2021-04-18 ENCOUNTER — Encounter: Payer: Self-pay | Admitting: Hematology and Oncology

## 2021-04-18 DIAGNOSIS — Z1379 Encounter for other screening for genetic and chromosomal anomalies: Secondary | ICD-10-CM

## 2021-04-18 DIAGNOSIS — Z803 Family history of malignant neoplasm of breast: Secondary | ICD-10-CM

## 2021-04-18 NOTE — Progress Notes (Signed)
HPI:  Ms. Cynthia [name] was previously seen in the [place] clinic due to a family history of breast cancer and concerns regarding a hereditary predisposition to cancer. Please refer to our prior cancer genetics clinic note for more information regarding our discussion, assessment and recommendations, at the time. Ms. Cynthia [name] recent genetic test results were disclosed to her, as were recommendations warranted by these results. These results and recommendations are discussed in more detail below.  CANCER HISTORY:  Oncology History   No history exists.    FAMILY HISTORY:  We obtained a detailed, 4-generation family history.  Significant diagnoses are listed below: Family History  Problem Relation Age of Onset  . Hypertension Mother   . Cervical cancer Sister 57  . Breast cancer Sister 92  . Diabetes Paternal Grandfather   . Lung cancer Paternal Grandfather   . Dementia Maternal Grandmother     The patient has three children who are all cancer free. She has a brother and sister.  Her sister was diagnosed with both breast and cervical cancer at age 15.  She is unavailable for testing at this time.  Both parents are living.  The patient's mother has not had cancer.  She has a brother who is cancer free.  Both maternal grandparents are deceased from non-cancer related issues.  The patient's father has a brother and sister who are cancer free.  His parents are deceased.  His father was diagnosed with lung cancer at 84 and he died at 20 from non cancer related issues.  His mother died at 89 from non cancer related issues.  Ms. Cynthia [name] is unaware of previous family history of genetic testing for hereditary cancer risks. Patient's maternal ancestors are of [place] descent, and paternal ancestors are of [place] and [place] descent. There is no reported Ashkenazi Jewish ancestry. There is no known consanguinity.  GENETIC TEST RESULTS: Genetic testing reported out on April 16, 2021 through  the CancerNext-Expanded+RNAinsight cancer panel found no pathogenic mutations. The CancerNext-Expanded gene panel offered by [facility] and includes sequencing and rearrangement analysis for the following 77 genes: AIP, ALK, APC*, ATM*, AXIN2, BAP1, BARD1, BLM, BMPR1A, BRCA1*, BRCA2*, BRIP1*, CDC73, CDH1*, CDK4, CDKN1B, CDKN2A, CHEK2*, CTNNA1, DICER1, FANCC, FH, FLCN, GALNT12, KIF1B, LZTR1, MAX, MEN1, MET, MLH1*, MSH2*, MSH3, MSH6*, MUTYH*, NBN, NF1*, NF2, NTHL1, PALB2*, PHOX2B, PMS2*, POT1, PRKAR1A, PTCH1, PTEN*, RAD51C*, RAD51D*, RB1, RECQL, RET, SDHA, SDHAF2, SDHB, SDHC, SDHD, SMAD4, SMARCA4, SMARCB1, SMARCE1, STK11, SUFU, TMEM127, TP53*, TSC1, TSC2, VHL and XRCC2 (sequencing and deletion/duplication); EGFR, EGLN1, HOXB13, KIT, MITF, PDGFRA, POLD1, and POLE (sequencing only); EPCAM and GREM1 (deletion/duplication only). DNA and RNA analyses performed for * genes. The test report has been scanned into EPIC and is located under the Molecular Pathology section of the Results Review tab.  A portion of the result report is included below for reference.     We discussed with Ms. Cynthia [name] that because current genetic testing is not perfect, it is possible there may be a gene mutation in one of these genes that current testing cannot detect, but that chance is small.  We also discussed, that there could be another gene that has not yet been discovered, or that we have not yet tested, that is responsible for the cancer diagnoses in the family. It is also possible there is a hereditary cause for the cancer in the family that Ms. Cynthia [name] did not inherit and therefore was not identified in her testing.  Therefore, it is important to remain in touch  with cancer genetics in the future so that we can continue to offer Ms. Cynthia [name] the most up to date genetic testing.   ADDITIONAL GENETIC TESTING: We discussed with Ms. Cynthia [name] that her genetic testing was fairly extensive.  If there are genes identified to increase cancer risk  that can be analyzed in the future, we would be happy to discuss and coordinate this testing at that time.    CANCER SCREENING RECOMMENDATIONS: Ms. Cynthia [name] test result is considered negative (normal).  This means that we have not identified a hereditary cause for her family history of breast cancer at this time. Most cancers happen by chance and this negative test suggests that her cancer may fall into this category.    While reassuring, this does not definitively rule out a hereditary predisposition to cancer. It is still possible that there could be genetic mutations that are undetectable by current technology. There could be genetic mutations in genes that have not been tested or identified to increase cancer risk.  Therefore, it is recommended she continue to follow the cancer management and screening guidelines provided by her primary healthcare provider.   An individual's cancer risk and medical management are not determined by genetic test results alone. Overall cancer risk assessment incorporates additional factors, including personal medical history, family history, and any available genetic information that may result in a personalized plan for cancer prevention and surveillance  Based on Ms. Cynthia [name]'s family of cancer, as well as her genetic test results, statistical models ([place])  and literature data were used to estimate her risk of developing breast cancer. These estimate her lifetime risk of developing breast cancer to be approximately 18.6%.  The patient's lifetime breast cancer risk is a preliminary estimate based on available information using one of several models endorsed by the [place] (ACS). The ACS recommends consideration of breast MRI screening as an adjunct to mammography for patients at high risk (defined as 20% or greater lifetime risk). A more detailed breast cancer risk assessment can be considered, if clinically indicated.     RECOMMENDATIONS FOR FAMILY  MEMBERS:  Individuals in this family might be at some increased risk of developing cancer, over the general population risk, simply due to the family history of cancer.  We recommended women in this family have a yearly mammogram beginning at age 18, or 22 years younger than the earliest onset of cancer, an annual clinical breast exam, and perform monthly breast self-exams. Women in this family should also have a gynecological exam as recommended by their primary provider. All family members should be referred for colonoscopy starting at age 68.  It is also possible there is a hereditary cause for the cancer in Ms. Cynthia [name]'s family that she did not inherit and therefore was not identified in her.  Based on Ms. Cynthia [name]'s family history, we recommended her sister, who was diagnosed with breast cancer at age 42, have genetic counseling and testing. Ms. Cynthia [name] will let us know if we can be of any assistance in coordinating genetic counseling and/or testing for this family member.   FOLLOW-UP: Lastly, we discussed with Ms. Cynthia [name] that cancer genetics is a rapidly advancing field and it is possible that new genetic tests will be appropriate for her and/or her family members in the future. We encouraged her to remain in contact with cancer genetics on an annual basis so we can update her personal and family histories and let her know of advances in cancer genetics that may benefit  this family.   Our contact number was provided. Ms. Cynthia [name] questions were answered to her satisfaction, and she knows she is welcome to call us at anytime with additional questions or concerns.   Roma Kayser, [place], [facility] Licensed, Certified Genetic Counselor [email or web address]

## 2021-04-18 NOTE — Telephone Encounter (Signed)
Revealed negative genetic testing.  Discussed that we do not know why she has cancer in the family. It could be due to a different gene that we are not testing, or maybe our current technology may not be able to pick something up.  It will be important for her to keep in contact with genetics to keep up with whether additional testing may be needed.

## 2021-04-20 ENCOUNTER — Ambulatory Visit: Payer: BC Managed Care – PPO

## 2021-04-20 ENCOUNTER — Inpatient Hospital Stay: Payer: BC Managed Care – PPO

## 2021-04-20 ENCOUNTER — Other Ambulatory Visit: Payer: Self-pay

## 2021-04-20 VITALS — BP 117/66 | HR 73 | Temp 98.2°F | Resp 18

## 2021-04-20 DIAGNOSIS — E611 Iron deficiency: Secondary | ICD-10-CM

## 2021-04-20 DIAGNOSIS — D509 Iron deficiency anemia, unspecified: Secondary | ICD-10-CM | POA: Diagnosis not present

## 2021-04-20 MED ORDER — SODIUM CHLORIDE 0.9 % IV SOLN
200.0000 mg | Freq: Once | INTRAVENOUS | Status: AC
  Administered 2021-04-20: 200 mg via INTRAVENOUS
  Filled 2021-04-20: qty 10

## 2021-04-20 MED ORDER — SODIUM CHLORIDE 0.9 % IV SOLN
Freq: Once | INTRAVENOUS | Status: AC
  Filled 2021-04-20: qty 250

## 2021-04-20 NOTE — Patient Instructions (Signed)

## 2021-05-01 ENCOUNTER — Ambulatory Visit
Admission: RE | Admit: 2021-05-01 | Discharge: 2021-05-01 | Disposition: A | Discharge status: Home / Self Care | Payer: BC Managed Care – PPO | Source: Ambulatory Visit | Attending: Family Medicine | Admitting: Family Medicine

## 2021-05-01 ENCOUNTER — Other Ambulatory Visit: Payer: Self-pay | Admitting: Family Medicine

## 2021-05-01 DIAGNOSIS — M79672 Pain in left foot: Secondary | ICD-10-CM

## 2021-05-25 DIAGNOSIS — M722 Plantar fascial fibromatosis: Secondary | ICD-10-CM

## 2021-05-25 HISTORY — DX: Plantar fascial fibromatosis: M72.2

## 2021-06-19 ENCOUNTER — Ambulatory Visit: Payer: BC Managed Care – PPO | Admitting: Hematology and Oncology

## 2021-06-19 ENCOUNTER — Other Ambulatory Visit: Payer: BC Managed Care – PPO

## 2021-06-26 ENCOUNTER — Inpatient Hospital Stay: Payer: BC Managed Care – PPO

## 2021-06-26 ENCOUNTER — Inpatient Hospital Stay: Payer: BC Managed Care – PPO | Admitting: Hematology and Oncology

## 2021-07-04 ENCOUNTER — Encounter: Payer: Self-pay | Admitting: Hematology and Oncology

## 2021-07-04 ENCOUNTER — Other Ambulatory Visit: Payer: Self-pay

## 2021-07-04 ENCOUNTER — Inpatient Hospital Stay: Payer: BC Managed Care – PPO | Attending: Hematology and Oncology

## 2021-07-04 ENCOUNTER — Inpatient Hospital Stay: Payer: BC Managed Care – PPO | Admitting: Hematology and Oncology

## 2021-07-04 DIAGNOSIS — D500 Iron deficiency anemia secondary to blood loss (chronic): Secondary | ICD-10-CM | POA: Diagnosis not present

## 2021-07-04 DIAGNOSIS — Z7984 Long term (current) use of oral hypoglycemic drugs: Secondary | ICD-10-CM | POA: Insufficient documentation

## 2021-07-04 DIAGNOSIS — D509 Iron deficiency anemia, unspecified: Secondary | ICD-10-CM | POA: Insufficient documentation

## 2021-07-04 DIAGNOSIS — E611 Iron deficiency: Secondary | ICD-10-CM

## 2021-07-04 DIAGNOSIS — G8929 Other chronic pain: Secondary | ICD-10-CM | POA: Insufficient documentation

## 2021-07-04 DIAGNOSIS — E119 Type 2 diabetes mellitus without complications: Secondary | ICD-10-CM | POA: Diagnosis not present

## 2021-07-04 DIAGNOSIS — R5382 Chronic fatigue, unspecified: Secondary | ICD-10-CM | POA: Insufficient documentation

## 2021-07-04 DIAGNOSIS — E221 Hyperprolactinemia: Secondary | ICD-10-CM | POA: Insufficient documentation

## 2021-07-04 DIAGNOSIS — D352 Benign neoplasm of pituitary gland: Secondary | ICD-10-CM | POA: Diagnosis not present

## 2021-07-04 DIAGNOSIS — Z79899 Other long term (current) drug therapy: Secondary | ICD-10-CM | POA: Diagnosis not present

## 2021-07-04 DIAGNOSIS — E785 Hyperlipidemia, unspecified: Secondary | ICD-10-CM | POA: Insufficient documentation

## 2021-07-04 LAB — CBC WITH DIFFERENTIAL (CANCER CENTER ONLY)
Abs Immature Granulocytes: 0.02 10*3/uL (ref 0.00–0.07)
Basophils Absolute: 0.1 10*3/uL (ref 0.0–0.1)
Basophils Relative: 1 %
Eosinophils Absolute: 0.1 10*3/uL (ref 0.0–0.5)
Eosinophils Relative: 1 %
HCT: 40.1 % (ref 36.0–46.0)
Hemoglobin: 12.8 g/dL (ref 12.0–15.0)
Immature Granulocytes: 0 %
Lymphocytes Relative: 35 %
Lymphs Abs: 3.2 10*3/uL (ref 0.7–4.0)
MCH: 29 pg (ref 26.0–34.0)
MCHC: 31.9 g/dL (ref 30.0–36.0)
MCV: 90.9 fL (ref 80.0–100.0)
Monocytes Absolute: 0.6 10*3/uL (ref 0.1–1.0)
Monocytes Relative: 6 %
Neutro Abs: 5.1 10*3/uL (ref 1.7–7.7)
Neutrophils Relative %: 57 %
Platelet Count: 280 10*3/uL (ref 150–400)
RBC: 4.41 MIL/uL (ref 3.87–5.11)
RDW: 14.6 % (ref 11.5–15.5)
WBC Count: 9.1 10*3/uL (ref 4.0–10.5)
nRBC: 0 % (ref 0.0–0.2)

## 2021-07-04 LAB — IRON AND TIBC
Iron: 75 ug/dL (ref 41–142)
Saturation Ratios: 19 % — ABNORMAL LOW (ref 21–57)
TIBC: 398 ug/dL (ref 236–444)
UIBC: 323 ug/dL (ref 120–384)

## 2021-07-04 LAB — FERRITIN: Ferritin: 87 ng/mL (ref 11–307)

## 2021-07-04 NOTE — Assessment & Plan Note (Signed)
This is a very pleasant 45 year old female patient with no significant past medical history except for prolactinoma and prediabetes, iron deficiency referred to hematology for evaluation and recommendations regarding her iron deficiency anemia.  She is status post intravenous iron infusion, has been doing really well since her IV iron.  Energy levels are improved, denies any pica, menstrual cycles have become lighter and regular.  No other complaints today. Physical examination unremarkable, no palpable lymphadenopathy or hepatosplenomegaly. Her hemoglobin has definitely normalized, iron panel and ferritin are pending at this time.  At this time, if her iron panel and ferritin are also within normal limits, plan is to follow-up in 6 months with repeat labs.

## 2021-07-04 NOTE — Progress Notes (Signed)
[name], [name]  CHIEF COMPLAINTS/PURPOSE OF CONSULTATION:  IDA, possible need for IV iron infusion  ASSESSMENT & PLAN:   Iron deficiency anemia This is a very pleasant 45 year old female patient with no significant past medical history except for prolactinoma and prediabetes, iron deficiency referred to hematology for evaluation and recommendations regarding her iron deficiency anemia.  She is status post intravenous iron infusion, has been doing really well since her IV iron.  Energy levels are improved, denies any pica, menstrual cycles have become lighter and regular.  No other complaints today. Physical examination unremarkable, no palpable lymphadenopathy or hepatosplenomegaly. Her hemoglobin has definitely normalized, iron panel and ferritin are pending at this time.  At this time, if her iron panel and ferritin are also within normal limits, plan is to follow-up in 6 months with repeat labs.  No orders of the defined types were placed in this encounter.  Age-appropriate cancer screening recommended, she is due for mammogram and colonoscopy.  HISTORY OF PRESENTING ILLNESS:   Cynthia [name] 45 y.o. female is here because of IDA  This is a very pleasant 45 year old female patient with past medical history significant for prediabetes, prolactinoma, chronic fatigue referred to hematology for evaluation and recommendations for iron deficiency. We initially tried oral iron for about 8 weeks but she continued to complain of worsening fatigue hence we have given her a trial of intravenous iron and she is here for follow-up on that.  She tells me that her energy levels are so much better since the iron infusion.  Menstrual cycles are regular.  No other bleeding noted.  She did go to see the genetic counselor, had genetic testing which was negative for any pathogenic mutations.  Rest of the pertinent 10  point ROS reviewed and negative.  She is to schedule her mammogram and her colonoscopy.  REVIEW OF SYSTEMS:   Constitutional: Denies fevers, chills or abnormal night sweats Eyes: Denies blurriness of vision, double vision or watery eyes Ears, nose, mouth, throat, and face: Denies mucositis or sore throat Respiratory: Denies cough, dyspnea or wheezes Cardiovascular: Denies palpitation, chest discomfort or lower extremity swelling Gastrointestinal:  Denies nausea, heartburn or change in bowel habits Skin: Denies abnormal skin rashes Lymphatics: Denies new lymphadenopathy or easy bruising Neurological:Denies numbness, tingling or new weaknesses Behavioral/Psych: Mood is stable, no new changes  All other systems were reviewed with the patient and are negative.  MEDICAL HISTORY:  Past Medical History:  Diagnosis Date   Allergic rhinitis due to pollen 03/06/2021   Chronic fatigue syndrome 03/06/2021   Chronic pain 03/06/2021   Contact with and (suspected) exposure to other viral communicable diseases 03/06/2021   Diabetes mellitus due to underlying condition with unspecified complications ([place]) [id number]   Diabetes mellitus without complication ([place])    Family history of breast cancer 04/04/2021   Head trauma    History of gestational diabetes mellitus 03/06/2021   Hyperprolactinemia ([place])    Iron deficiency 03/06/2021   Nonsustained ventricular tachycardia ([place]) 09/21/2018   Obesity 03/06/2021   Palpitations 03/03/2018   Pituitary tumor    is now resolved   Prediabetes    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    SURGICAL HISTORY: Past Surgical History:  Procedure Laterality Date   CESAREAN SECTION      SOCIAL HISTORY: Social History   Socioeconomic History   Marital status: Married    Spouse name: Not on file  Number of children: Not on file   Years of education: Not on file   Highest education level: Not on file  Occupational History   Not on file  Tobacco Use   Smoking status:  Former   Smokeless tobacco: Never  Vaping Use   Vaping Use: Never used  Substance and Sexual Activity   Alcohol use: Not Currently    Comment: "very rarely"   Drug use: Never   Sexual activity: Not on file  Other Topics Concern   Not on file  Social History Narrative   Not on file   Social Determinants of Health   Financial Resource Strain: Not on file  Food Insecurity: Not on file  Transportation Needs: Not on file  Physical Activity: Not on file  Stress: Not on file  Social Connections: Not on file  Intimate Partner Violence: Not on file    FAMILY HISTORY: Family History  Problem Relation Age of Onset   Hypertension Mother    Cervical cancer Sister 8   Breast cancer Sister 44   Diabetes Paternal Grandfather    Lung cancer Paternal Grandfather    Dementia Maternal Grandmother     ALLERGIES:  is allergic to amoxicillin-pot clavulanate, azelastine, azelastine hcl, montelukast sodium, latex, and penicillins.  MEDICATIONS:  Current Outpatient Medications  Medication Sig Dispense Refill   atorvastatin (LIPITOR) 10 MG tablet Take 1 tablet by mouth once daily 90 tablet 0   cyanocobalamin 1000 MCG tablet Take 1,000 mcg by mouth daily.     Ferrous Sulfate (IRON) 325 (65 Fe) MG TABS Take 1 tablet by mouth daily.     fexofenadine (ALLEGRA) 180 MG tablet Take 180 mg by mouth daily.     levonorgestrel-ethinyl estradiol (ALESSE) 0.1-20 MG-MCG tablet Take 1 tablet by mouth daily at 12 noon.     meclizine (ANTIVERT) 25 MG tablet Take 25 mg by mouth 2 (two) times daily as needed for dizziness.     metFORMIN (GLUCOPHAGE) 500 MG tablet Take 500 mg by mouth 3 (three) times daily.     metoprolol succinate (TOPROL-XL) 50 MG 24 hr tablet Take 50 mg by mouth daily. Take with or immediately following a meal.     Multiple Vitamins-Minerals (MULTIVITAMIN/EXTRA VITAMIN D3) CHEW Chew 1 Dose by mouth daily.     omeprazole-sodium bicarbonate (ZEGERID) 40-1100 MG capsule Take 1 capsule by mouth  daily before breakfast.     triamcinolone ointment (KENALOG) 0.1 % Apply 1 application topically in the morning and at bedtime.     No current facility-administered medications for this visit.    PHYSICAL EXAMINATION:  ECOG PERFORMANCE STATUS: 1 - Symptomatic but completely ambulatory  Vitals:   07/04/21 1438  BP: 117/66  Pulse: 72  Resp: 18  Temp: 98.5 F (36.9 C)  SpO2: 97%   Filed Weights   07/04/21 1438  Weight: 186 lb 8 oz (84.6 kg)    GENERAL:alert, no distress and comfortable SKIN: skin color, texture, turgor are normal, no rashes or significant lesions EYES: normal, conjunctiva are pink and non-injected, sclera clear OROPHARYNX:no exudate, no erythema and lips, buccal mucosa, and tongue normal  NECK: supple, thyroid normal size, non-tender, without nodularity LYMPH:  no palpable lymphadenopathy in the cervical LUNGS: clear to auscultation and percussion with normal breathing effort HEART: regular rate & rhythm and no murmurs and no lower extremity edema ABDOMEN:abdomen soft, non-tender and normal bowel sounds Musculoskeletal:no cyanosis of digits and no clubbing  PSYCH: alert & oriented x 3 with fluent speech NEURO: no  focal motor/sensory deficits  LABORATORY DATA:  I have reviewed the data as listed Lab Results  Component Value Date   WBC 9.1 07/04/2021   HGB 12.8 07/04/2021   HCT 40.1 07/04/2021   MCV 90.9 07/04/2021   PLT 280 07/04/2021     Chemistry      Component Value Date/Time   NA 141 04/11/2020 0932   K 4.1 04/11/2020 0932   CL 106 04/11/2020 0932   CO2 22 04/11/2020 0932   BUN 14 04/11/2020 0932   CREATININE 0.67 04/11/2020 0932      Component Value Date/Time   CALCIUM 8.9 04/11/2020 0932   ALKPHOS 70 05/29/2020 1001   AST 10 05/29/2020 1001   ALT 8 05/29/2020 1001   BILITOT 0.6 05/29/2020 1001     CBC showed white blood cell count at 9.1, hemoglobin at 12.8, platelet count of 280,000  RADIOGRAPHIC STUDIES: I have personally  reviewed the radiological images as listed and agreed with the findings in the report. No results found.  All questions were answered. The patient knows to call the clinic with any problems, questions or concerns. I spent 15 minutes in the care of this patient including H and P, review of records, counseling and coordination of care.     Benay Pike, MD 07/04/2021 3:09 PM

## 2021-07-26 DIAGNOSIS — M722 Plantar fascial fibromatosis: Secondary | ICD-10-CM | POA: Diagnosis not present

## 2021-09-02 ENCOUNTER — Other Ambulatory Visit: Payer: Self-pay | Admitting: Cardiology

## 2021-09-03 DIAGNOSIS — Z01419 Encounter for gynecological examination (general) (routine) without abnormal findings: Secondary | ICD-10-CM | POA: Diagnosis not present

## 2021-09-25 ENCOUNTER — Other Ambulatory Visit: Payer: Self-pay

## 2021-09-26 ENCOUNTER — Ambulatory Visit: Payer: BC Managed Care – PPO | Admitting: Cardiology

## 2021-09-26 ENCOUNTER — Encounter: Payer: Self-pay | Admitting: Cardiology

## 2021-09-26 ENCOUNTER — Other Ambulatory Visit: Payer: Self-pay

## 2021-09-26 VITALS — BP 108/62 | HR 62 | Ht 64.0 in | Wt 178.0 lb

## 2021-09-26 DIAGNOSIS — E088 Diabetes mellitus due to underlying condition with unspecified complications: Secondary | ICD-10-CM

## 2021-09-26 DIAGNOSIS — E669 Obesity, unspecified: Secondary | ICD-10-CM | POA: Diagnosis not present

## 2021-09-26 DIAGNOSIS — I4729 Other ventricular tachycardia: Secondary | ICD-10-CM | POA: Diagnosis not present

## 2021-09-26 NOTE — Progress Notes (Signed)
At the time of my evaluation, the patient is alert awake oriented and in no distress. Cardiology Office Note:    Date:  09/26/2021   ID:  Cynthia [name], DOB 1976-08-05, MRN [id number]  PCP:  Maurice Small, MD (Inactive)  Cardiologist:  Jenean Lindau, MD   Referring MD: No ref. provider found    ASSESSMENT:    1. Nonsustained ventricular tachycardia   2. Diabetes mellitus due to underlying condition with unspecified complications ([place])   3. Obesity (BMI 30.0-34.9)    PLAN:    In order of problems listed above:  Primary prevention stressed with patient.  Importance of compliance with diet medication stressed and she vocalized understanding.  She was advised to walk at least half an hour a day 5 days a week and she promises Nonsustained ventricular tachycardia: Stable and asymptomatic she is on beta-blocker.  She has had no symptoms.  She is happy about it. Mixed dyslipidemia: On statin therapy.  Doing well with diet and exercise.  She has lost weight.  Lipids were reviewed and discussed with her and they are fine. Diabetes mellitus: Managed by primary care.  Diet emphasized. Patient will be seen in follow-up appointment in 6 months or earlier if the patient has any concerns    Medication Adjustments/Labs and Tests Ordered: Current medicines are reviewed at length with the patient today.  Concerns regarding medicines are outlined above.  No orders of the defined types were placed in this encounter.  No orders of the defined types were placed in this encounter.    No chief complaint on file.    History of Present Illness:    Cynthia [name] is a 45 y.o. female.  Patient has past medical history of mixed dyslipidemia, diabetes mellitus, nonsustained ventricular tachycardia.  She denies any problems at this time and takes care of activities of daily living.  No chest pain orthopnea or PND.  She has done well with diet and lost weight.  She is happy about it.  At the time of my  evaluation, the patient is alert awake oriented and in no distress.  At the time of my evaluation, the patient is alert awake oriented and in no distress.  Past Medical History:  Diagnosis Date   Allergic rhinitis due to pollen 03/06/2021   Chronic fatigue syndrome 03/06/2021   Chronic pain 03/06/2021   Contact with and (suspected) exposure to other viral communicable diseases 03/06/2021   Diabetes mellitus due to underlying condition with unspecified complications ([place]) 11/15/7827   Diabetes mellitus without complication [facility])    Family history of breast cancer 04/04/2021   Genetic testing 04/17/2021   Negative genetic testing on the CancerNext-Expanded+RNAinsight panel.  The CancerNext-Expanded gene panel offered by [facility] and includes sequencing and rearrangement analysis for the following 77 genes: AIP, ALK, APC*, ATM*, AXIN2, BAP1, BARD1, BLM, BMPR1A, BRCA1*, BRCA2*, BRIP1*, CDC73, CDH1*, CDK4, CDKN1B, CDKN2A, CHEK2*, CTNNA1, DICER1, FANCC, FH, FLCN, GALNT12, KIF1B, LZTR1, MAX, MEN1,   Head trauma    History of gestational diabetes mellitus 03/06/2021   Hyperprolactinemia ([place])    Iron deficiency 03/06/2021   Iron deficiency anemia 04/13/2021   Nonsustained ventricular tachycardia 09/21/2018   Obesity 03/06/2021   Obesity (BMI 30.0-34.9) 04/13/2021   Palpitations 03/03/2018   Pituitary tumor    is now resolved   Plantar fasciitis of left foot 05/25/2021   Prediabetes    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   Ferrous Sulfate (IRON) 325 (65 Fe) MG TABS Take 1 tablet by mouth daily.   fexofenadine (ALLEGRA) 180 MG tablet Take 180 mg by mouth daily.   levonorgestrel-ethinyl estradiol (ALESSE) 0.1-20 MG-MCG tablet Take 1 tablet by  mouth daily at 12 noon.   meclizine (ANTIVERT) 25 MG tablet Take 25 mg by mouth 2 (two) times daily as needed for dizziness.   metFORMIN (GLUCOPHAGE-XR) 500 MG 24 hr tablet Take 2,000 mg by mouth daily.   metoprolol succinate (TOPROL-XL) 50 MG 24 hr tablet Take 1 tablet (50 mg total) by mouth daily.   omeprazole-sodium bicarbonate (ZEGERID) 40-1100 MG capsule Take 1 capsule by mouth daily before breakfast.     Allergies:   Azelastine, Montelukast, Amoxicillin-pot clavulanate, Montelukast sodium, Latex, and Penicillins   Social History   Socioeconomic History   Marital status: Married    Spouse name: Not on file   Number of children: Not on file   Years of education: Not on file   Highest education level: Not on file  Occupational History   Not on file  Tobacco Use   Smoking status: Former   Smokeless tobacco: Never  Vaping Use   Vaping Use: Never used  Substance and Sexual Activity   Alcohol use: Not Currently    Comment: "very rarely"   Drug use: Never   Sexual activity: Not on file  Other Topics Concern   Not on file  Social History Narrative   Not on file   Social Determinants of Health   Financial Resource Strain: Not on file  Food Insecurity: Not on file  Transportation Needs: Not on file  Physical Activity: Not on file  Stress: Not on file  Social Connections: Not on file     Family History: The patient's family history includes Breast cancer (age of onset: 76) in her sister; Cervical cancer (age of onset: 55) in her sister; Dementia in her maternal grandmother; Diabetes in her paternal grandfather; Hypertension in her mother; Lung cancer in her paternal grandfather.  ROS:   Please see the history of present illness.    All other systems reviewed and are negative.  EKGs/Labs/Other Studies Reviewed:    The following studies were reviewed today: I discussed my findings with the patient at length   Recent Labs: 07/04/2021: Hemoglobin 12.8; Platelet Count  280  Recent Lipid Panel    Component Value Date/Time   CHOL 142 05/29/2020 1001   TRIG 89 05/29/2020 1001   HDL 47 05/29/2020 1001   CHOLHDL 3.0 05/29/2020 1001   LDLCALC 78 05/29/2020 1001    Physical Exam:    VS:  BP 108/62   Pulse 62   Ht 5' 4"  (1.626 m)   Wt 178 lb (80.7 kg)   SpO2 97%   BMI 30.55 kg/m     Wt Readings from Last 3 Encounters:  09/26/21 178 lb (80.7 kg)  07/04/21 186 lb 8 oz (84.6 kg)  04/13/21 197 lb (89.4 kg)     GEN: Patient is in no acute distress HEENT: Normal NECK: No JVD; No carotid bruits LYMPHATICS: No lymphadenopathy CARDIAC: Hear sounds regular, 2/6 systolic murmur at the apex. RESPIRATORY:  Clear to auscultation without rales, wheezing or rhonchi  ABDOMEN: Soft, non-tender, non-distended MUSCULOSKELETAL:  No edema; No deformity  SKIN: Warm and  dry NEUROLOGIC:  Alert and oriented x 3 PSYCHIATRIC:  Normal affect   Signed, Jenean Lindau, MD  09/26/2021 10:43 AM    [place]

## 2021-09-26 NOTE — Patient Instructions (Signed)

## 2021-10-03 DIAGNOSIS — M722 Plantar fascial fibromatosis: Secondary | ICD-10-CM | POA: Diagnosis not present

## 2021-10-16 DIAGNOSIS — M722 Plantar fascial fibromatosis: Secondary | ICD-10-CM | POA: Diagnosis not present

## 2021-11-28 ENCOUNTER — Other Ambulatory Visit: Payer: Self-pay

## 2021-11-28 DIAGNOSIS — Z1211 Encounter for screening for malignant neoplasm of colon: Secondary | ICD-10-CM | POA: Diagnosis not present

## 2021-11-28 MED ORDER — METOPROLOL SUCCINATE ER 50 MG PO TB24: 50.0000 mg | ORAL_TABLET | Freq: Every day | ORAL | 2 refills | Status: DC

## 2021-11-29 DIAGNOSIS — M4316 Spondylolisthesis, lumbar region: Secondary | ICD-10-CM | POA: Diagnosis not present

## 2021-11-29 DIAGNOSIS — M545 Low back pain, unspecified: Secondary | ICD-10-CM | POA: Diagnosis not present

## 2021-11-29 DIAGNOSIS — M47816 Spondylosis without myelopathy or radiculopathy, lumbar region: Secondary | ICD-10-CM | POA: Diagnosis not present

## 2021-11-29 DIAGNOSIS — M5136 Other intervertebral disc degeneration, lumbar region: Secondary | ICD-10-CM | POA: Diagnosis not present

## 2021-11-30 ENCOUNTER — Other Ambulatory Visit: Payer: Self-pay | Admitting: Rehabilitation

## 2021-11-30 DIAGNOSIS — M47816 Spondylosis without myelopathy or radiculopathy, lumbar region: Secondary | ICD-10-CM

## 2021-12-25 DIAGNOSIS — M5451 Vertebrogenic low back pain: Secondary | ICD-10-CM | POA: Diagnosis not present

## 2022-01-01 ENCOUNTER — Telehealth: Payer: Self-pay | Admitting: Hematology and Oncology

## 2022-01-01 NOTE — Telephone Encounter (Signed)
R/Sch per 2/20 inbasket, pt aware

## 2022-01-02 ENCOUNTER — Inpatient Hospital Stay: Payer: BC Managed Care – PPO

## 2022-01-02 ENCOUNTER — Inpatient Hospital Stay: Payer: BC Managed Care – PPO | Admitting: Hematology and Oncology

## 2022-01-02 DIAGNOSIS — M5451 Vertebrogenic low back pain: Secondary | ICD-10-CM | POA: Diagnosis not present

## 2022-01-06 ENCOUNTER — Ambulatory Visit
Admission: RE | Admit: 2022-01-06 | Discharge: 2022-01-06 | Disposition: A | Discharge status: Home / Self Care | Payer: BC Managed Care – PPO | Source: Ambulatory Visit | Attending: Rehabilitation | Admitting: Rehabilitation

## 2022-01-06 DIAGNOSIS — M545 Low back pain, unspecified: Secondary | ICD-10-CM | POA: Diagnosis not present

## 2022-01-06 DIAGNOSIS — M25551 Pain in right hip: Secondary | ICD-10-CM | POA: Diagnosis not present

## 2022-01-06 DIAGNOSIS — M47816 Spondylosis without myelopathy or radiculopathy, lumbar region: Secondary | ICD-10-CM

## 2022-01-09 DIAGNOSIS — M5451 Vertebrogenic low back pain: Secondary | ICD-10-CM | POA: Diagnosis not present

## 2022-01-10 DIAGNOSIS — M4316 Spondylolisthesis, lumbar region: Secondary | ICD-10-CM | POA: Diagnosis not present

## 2022-01-10 DIAGNOSIS — M47816 Spondylosis without myelopathy or radiculopathy, lumbar region: Secondary | ICD-10-CM | POA: Diagnosis not present

## 2022-01-16 DIAGNOSIS — M5451 Vertebrogenic low back pain: Secondary | ICD-10-CM | POA: Diagnosis not present

## 2022-01-23 DIAGNOSIS — M5451 Vertebrogenic low back pain: Secondary | ICD-10-CM | POA: Diagnosis not present

## 2022-01-30 DIAGNOSIS — M5451 Vertebrogenic low back pain: Secondary | ICD-10-CM | POA: Diagnosis not present

## 2022-02-06 ENCOUNTER — Inpatient Hospital Stay: Payer: BC Managed Care – PPO | Attending: Hematology and Oncology

## 2022-02-06 ENCOUNTER — Encounter: Payer: Self-pay | Admitting: Hematology and Oncology

## 2022-02-06 ENCOUNTER — Inpatient Hospital Stay: Payer: BC Managed Care – PPO | Admitting: Hematology and Oncology

## 2022-02-06 ENCOUNTER — Other Ambulatory Visit: Payer: Self-pay | Admitting: *Deleted

## 2022-02-06 ENCOUNTER — Other Ambulatory Visit: Payer: Self-pay

## 2022-02-06 DIAGNOSIS — D509 Iron deficiency anemia, unspecified: Secondary | ICD-10-CM | POA: Diagnosis not present

## 2022-02-06 DIAGNOSIS — D500 Iron deficiency anemia secondary to blood loss (chronic): Secondary | ICD-10-CM

## 2022-02-06 DIAGNOSIS — E611 Iron deficiency: Secondary | ICD-10-CM

## 2022-02-06 DIAGNOSIS — M5451 Vertebrogenic low back pain: Secondary | ICD-10-CM | POA: Diagnosis not present

## 2022-02-06 LAB — CBC WITH DIFFERENTIAL (CANCER CENTER ONLY)
Abs Immature Granulocytes: 0.01 10*3/uL (ref 0.00–0.07)
Basophils Absolute: 0 10*3/uL (ref 0.0–0.1)
Basophils Relative: 1 %
Eosinophils Absolute: 0.1 10*3/uL (ref 0.0–0.5)
Eosinophils Relative: 2 %
HCT: 37.4 % (ref 36.0–46.0)
Hemoglobin: 12.5 g/dL (ref 12.0–15.0)
Immature Granulocytes: 0 %
Lymphocytes Relative: 35 %
Lymphs Abs: 2.2 10*3/uL (ref 0.7–4.0)
MCH: 29.9 pg (ref 26.0–34.0)
MCHC: 33.4 g/dL (ref 30.0–36.0)
MCV: 89.5 fL (ref 80.0–100.0)
Monocytes Absolute: 0.6 10*3/uL (ref 0.1–1.0)
Monocytes Relative: 10 %
Neutro Abs: 3.2 10*3/uL (ref 1.7–7.7)
Neutrophils Relative %: 52 %
Platelet Count: 233 10*3/uL (ref 150–400)
RBC: 4.18 MIL/uL (ref 3.87–5.11)
RDW: 13.7 % (ref 11.5–15.5)
WBC Count: 6.1 10*3/uL (ref 4.0–10.5)
nRBC: 0 % (ref 0.0–0.2)

## 2022-02-06 LAB — IRON AND IRON BINDING CAPACITY (CC-WL,HP ONLY)
Iron: 29 ug/dL (ref 28–170)
Saturation Ratios: 7 % — ABNORMAL LOW (ref 10.4–31.8)
TIBC: 423 ug/dL (ref 250–450)
UIBC: 394 ug/dL

## 2022-02-06 LAB — FERRITIN: Ferritin: 132 ng/mL (ref 11–307)

## 2022-02-06 NOTE — Assessment & Plan Note (Addendum)
This is a very pleasant 46 year old female patient with no significant past medical history except for prolactinoma and prediabetes, iron deficiency referred to hematology for evaluation and recommendations regarding her iron deficiency anemia.  ?She last received iron infusion in June 2022 and since then has been doing remarkably well. ?Physical examination today unremarkable, no palpable lymphadenopathy or hepatosplenomegaly. ?I have reviewed her CBC, within normal limits, iron panel and ferritin pending at the time of my visit.  I have recommended that she continue follow-up in 6 months.  She should proceed with the mammogram. ?She had colonoscopy in January and according to the patient it was within normal limits.  I do not have access to these reports at this time. ?Return to clinic in 6 months. ?

## 2022-02-06 NOTE — Progress Notes (Signed)
Cynthia [name] ?CONSULT NOTE ? ?Patient Care Team: ?Maurice Small, [name] ? ?CHIEF COMPLAINTS/PURPOSE OF CONSULTATION:  ?IDA, possible need for IV iron infusion ? ?ASSESSMENT & PLAN:  ? ?Iron deficiency anemia ?This is a very pleasant 46 year old female patient with no significant past medical history except for prolactinoma and prediabetes, iron deficiency referred to hematology for evaluation and recommendations regarding her iron deficiency anemia.  ?She last received iron infusion in June 2022 and since then has been doing remarkably well. ?Physical examination today unremarkable, no palpable lymphadenopathy or hepatosplenomegaly. ?I have reviewed her CBC, within normal limits, iron panel and ferritin pending at the time of my visit.  I have recommended that she continue follow-up in 6 months.  She should proceed with the mammogram. ?She had colonoscopy in January and according to the patient it was within normal limits.  I do not have access to these reports at this time. ?Return to clinic in 6 months. ? ? ?No orders of the defined types were placed in this encounter. ? ? ?Age-appropriate cancer screening recommended, she is due for mammogram and colonoscopy. ? ?HISTORY OF PRESENTING ILLNESS:  ? ?Cynthia [name] 47 y.o. female is here because of IDA ? ?HPI ? ?This is a very pleasant 46 year old female patient with past medical history significant for prediabetes, prolactinoma, chronic fatigue referred to hematology for evaluation and recommendations for iron deficiency. ?We initially tried oral iron for about 8 weeks but she continued to complain of worsening fatigue hence we have given her a trial of intravenous iron and she is here for follow-up. ? ?Interval history ?Cynthia [name] is here for follow-up.  Since her last visit she has been doing remarkably well.  No complaints at all.  No change in her bowel habits or urinary habits.  No increasing menstrual bleeding.  No  hematochezia or melena.  She continues to take oral iron once a day.  Rest of the pertinent 10 point ROS reviewed and negative. ? ?MEDICAL HISTORY:  ?Past Medical History:  ?Diagnosis Date  ? Allergic rhinitis due to pollen 03/06/2021  ? Chronic fatigue syndrome 03/06/2021  ? Chronic pain 03/06/2021  ? Contact with and (suspected) exposure to other viral communicable diseases 03/06/2021  ? Diabetes mellitus due to underlying condition with unspecified complications ([place]) 01/12/1961  ? Diabetes mellitus without complication ([place])   ? Family history of breast cancer 04/04/2021  ? Genetic testing 04/17/2021  ? Negative genetic testing on the CancerNext-Expanded+RNAinsight panel.  The CancerNext-Expanded gene panel offered by [facility] and includes sequencing and rearrangement analysis for the following 77 genes: AIP, ALK, APC*, ATM*, AXIN2, BAP1, BARD1, BLM, BMPR1A, BRCA1*, BRCA2*, BRIP1*, CDC73, CDH1*, CDK4, CDKN1B, CDKN2A, CHEK2*, CTNNA1, DICER1, FANCC, FH, FLCN, GALNT12, KIF1B, LZTR1, MAX, MEN1,  ? Head trauma   ? History of gestational diabetes mellitus 03/06/2021  ? Hyperprolactinemia ([place])   ? Iron deficiency 03/06/2021  ? Iron deficiency anemia 04/13/2021  ? Nonsustained ventricular tachycardia 09/21/2018  ? Obesity 03/06/2021  ? Obesity (BMI 30.0-34.9) 04/13/2021  ? Palpitations 03/03/2018  ? Pituitary tumor   ? is now resolved  ? Plantar fasciitis of left foot 05/25/2021  ? Prediabetes   ? 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 ? ?SURGICAL HISTORY: ?Past Surgical History:  ?Procedure Laterality Date  ? CESAREAN SECTION    ? ? ?SOCIAL HISTORY: ?Social History  ? ?Socioeconomic History  ? Marital status: Married  ?  Spouse name: Not on file  ? Number of  children: Not on file  ? Years of education: Not on file  ? Highest education level: Not on file  ?Occupational History  ? Not on file  ?Tobacco Use  ? Smoking status: Former  ? Smokeless tobacco: Never  ?Vaping Use  ? Vaping Use: Never used  ?Substance and Sexual Activity  ?  Alcohol use: Not Currently  ?  Comment: "very rarely"  ? Drug use: Never  ? Sexual activity: Not on file  ?Other Topics Concern  ? Not on file  ?Social History Narrative  ? Not on file  ? ?Social Determinants of Health  ? ?Financial Resource Strain: Not on file  ?Food Insecurity: Not on file  ?Transportation Needs: Not on file  ?Physical Activity: Not on file  ?Stress: Not on file  ?Social Connections: Not on file  ?Intimate Partner Violence: Not on file  ? ? ?FAMILY HISTORY: ?Family History  ?Problem Relation Age of Onset  ? Hypertension Mother   ? Cervical cancer Sister 22  ? Breast cancer Sister 7  ? Diabetes Paternal Grandfather   ? Lung cancer Paternal Grandfather   ? Dementia Maternal Grandmother   ? ? ?ALLERGIES:  is allergic to azelastine, montelukast, amoxicillin-pot clavulanate, montelukast sodium, latex, and penicillins. ? ?MEDICATIONS:  ?Current Outpatient Medications  ?Medication Sig Dispense Refill  ? atorvastatin (LIPITOR) 10 MG tablet Take 1 tablet by mouth once daily 90 tablet 0  ? cyanocobalamin 1000 MCG tablet Take 1,000 mcg by mouth daily.    ? Ferrous Sulfate (IRON) 325 (65 Fe) MG TABS Take 1 tablet by mouth daily.    ? fexofenadine (ALLEGRA) 180 MG tablet Take 180 mg by mouth daily.    ? levonorgestrel-ethinyl estradiol (ALESSE) 0.1-20 MG-MCG tablet Take 1 tablet by mouth daily at 12 noon.    ? meclizine (ANTIVERT) 25 MG tablet Take 25 mg by mouth 2 (two) times daily as needed for dizziness.    ? metFORMIN (GLUCOPHAGE-XR) 500 MG 24 hr tablet Take 2,000 mg by mouth daily.    ? metoprolol succinate (TOPROL-XL) 50 MG 24 hr tablet Take 1 tablet (50 mg total) by mouth daily. 90 tablet 2  ? omeprazole-sodium bicarbonate (ZEGERID) 40-1100 MG capsule Take 1 capsule by mouth daily before breakfast.    ? ?No current facility-administered medications for this visit.  ? ? ?PHYSICAL EXAMINATION: ? ?ECOG PERFORMANCE STATUS: 1 - Symptomatic but completely ambulatory ? ?Vitals:  ? 02/06/22 1350  ?BP: 129/80   ?Pulse: 85  ?Resp: 16  ?Temp: 97.7 ?F (36.5 ?C)  ?SpO2: 98%  ? ? ?Filed Weights  ? 02/06/22 1350  ?Weight: 178 lb 14.4 oz (81.1 kg)  ? ?Physical Exam ?Constitutional:   ?   Appearance: Normal appearance.  ?Cardiovascular:  ?   Rate and Rhythm: Normal rate and regular rhythm.  ?   Pulses: Normal pulses.  ?   Heart sounds: Normal heart sounds.  ?Pulmonary:  ?   Effort: Pulmonary effort is normal.  ?   Breath sounds: Normal breath sounds.  ?Abdominal:  ?   General: Abdomen is flat. Bowel sounds are normal.  ?   Palpations: Abdomen is soft.  ?Musculoskeletal:     ?   General: Normal range of motion.  ?   Cervical back: Normal range of motion and neck supple. No rigidity.  ?Lymphadenopathy:  ?   Cervical: No cervical adenopathy.  ?Skin: ?   General: Skin is warm and dry.  ?Neurological:  ?   General: No focal deficit present.  ?  Mental Status: She is alert.  ?Psychiatric:     ?   Mood and Affect: Mood normal.  ? ? ? ?LABORATORY DATA:  ?I have reviewed the data as listed ?Lab Results  ?Component Value Date  ? WBC 6.1 02/06/2022  ? HGB 12.5 02/06/2022  ? HCT 37.4 02/06/2022  ? MCV 89.5 02/06/2022  ? PLT 233 02/06/2022  ? ?  Chemistry   ?   ?Component Value Date/Time  ? NA 141 04/11/2020 0932  ? K 4.1 04/11/2020 0932  ? CL 106 04/11/2020 0932  ? CO2 22 04/11/2020 0932  ? BUN 14 04/11/2020 0932  ? CREATININE 0.67 04/11/2020 0932  ?    ?Component Value Date/Time  ? CALCIUM 8.9 04/11/2020 0932  ? ALKPHOS 70 05/29/2020 1001  ? AST 10 05/29/2020 1001  ? ALT 8 05/29/2020 1001  ? BILITOT 0.6 05/29/2020 1001  ?  ? ?CBC from today reviewed and within normal limits.  Iron panel pending at the time of my visit. ? ?RADIOGRAPHIC STUDIES: ?I have personally reviewed the radiological images as listed and agreed with the findings in the report. ?No results found. ? ?All questions were answered. The patient knows to call the clinic with any problems, questions or concerns. ?I spent 15 minutes in the care of this patient including H and  P, review of records, counseling and coordination of care. ? ?  ? Benay Pike, MD ?02/06/2022 2:07 PM ? ? ? ?

## 2022-02-07 ENCOUNTER — Encounter: Payer: Self-pay | Admitting: Hematology and Oncology

## 2022-02-07 DIAGNOSIS — B349 Viral infection, unspecified: Secondary | ICD-10-CM | POA: Diagnosis not present

## 2022-02-14 DIAGNOSIS — E221 Hyperprolactinemia: Secondary | ICD-10-CM | POA: Diagnosis not present

## 2022-02-14 DIAGNOSIS — R7303 Prediabetes: Secondary | ICD-10-CM | POA: Diagnosis not present

## 2022-02-14 DIAGNOSIS — E669 Obesity, unspecified: Secondary | ICD-10-CM | POA: Diagnosis not present

## 2022-03-07 ENCOUNTER — Other Ambulatory Visit: Payer: Self-pay | Admitting: Family Medicine

## 2022-03-07 DIAGNOSIS — Z1231 Encounter for screening mammogram for malignant neoplasm of breast: Secondary | ICD-10-CM

## 2022-03-12 DIAGNOSIS — M4316 Spondylolisthesis, lumbar region: Secondary | ICD-10-CM | POA: Diagnosis not present

## 2022-03-12 DIAGNOSIS — M47816 Spondylosis without myelopathy or radiculopathy, lumbar region: Secondary | ICD-10-CM | POA: Diagnosis not present

## 2022-03-15 ENCOUNTER — Encounter: Payer: Self-pay | Admitting: Hematology and Oncology

## 2022-03-15 ENCOUNTER — Ambulatory Visit
Admission: RE | Admit: 2022-03-15 | Discharge: 2022-03-15 | Disposition: A | Discharge status: Home / Self Care | Payer: BC Managed Care – PPO | Source: Ambulatory Visit | Attending: Family Medicine | Admitting: Family Medicine

## 2022-03-15 DIAGNOSIS — Z1231 Encounter for screening mammogram for malignant neoplasm of breast: Secondary | ICD-10-CM

## 2022-05-02 ENCOUNTER — Encounter: Payer: Self-pay | Admitting: Cardiology

## 2022-05-02 ENCOUNTER — Ambulatory Visit: Payer: BC Managed Care – PPO | Admitting: Cardiology

## 2022-05-02 VITALS — BP 120/68 | HR 76 | Ht 64.0 in | Wt 170.1 lb

## 2022-05-02 DIAGNOSIS — E088 Diabetes mellitus due to underlying condition with unspecified complications: Secondary | ICD-10-CM

## 2022-05-02 DIAGNOSIS — I4729 Other ventricular tachycardia: Secondary | ICD-10-CM

## 2022-05-02 DIAGNOSIS — E669 Obesity, unspecified: Secondary | ICD-10-CM

## 2022-05-02 MED ORDER — ATORVASTATIN CALCIUM 10 MG PO TABS: 10.0000 mg | ORAL_TABLET | Freq: Every day | ORAL | 3 refills | Status: AC

## 2022-05-02 NOTE — Patient Instructions (Signed)

## 2022-05-02 NOTE — Progress Notes (Signed)
Cardiology Office Note:    Date:  05/02/2022   ID:  Cynthia [name], DOB [birth year], MRN [id number]  PCP:  Maurice Small, MD  Cardiologist:  Jenean Lindau, MD   Referring MD: Maurice Small, MD    ASSESSMENT:    1. Nonsustained ventricular tachycardia (Cynthia [name])   2. Diabetes mellitus due to underlying condition with unspecified complications (Cynthia [name])   3. Obesity (BMI 30.0-34.9)    PLAN:    In order of problems listed above:  Primary prevention stressed with the patient.  Importance of compliance with diet and medication stressed and she vocalized standing.  She was advised to walk at least half an hour a day 5 days a week. Essential hypertension: Blood pressure stable and diet was emphasized.  Lifestyle modification urged. Mixed dyslipidemia: Lipids reviewed and they are fine.  She follows up with her primary care. Obesity: Weight reduction stressed diet was emphasized for diabetes mellitus.  Patient promises to do better.  Hemoglobin A1c from KPN sheet is mildly elevated and I cautioned her against this. Patient will be seen in follow-up appointment in 12 months or earlier if the patient has any concerns    Medication Adjustments/Labs and Tests Ordered: Current medicines are reviewed at length with the patient today.  Concerns regarding medicines are outlined above.  No orders of the defined types were placed in this encounter.  No orders of the defined types were placed in this encounter.    No chief complaint on file.    History of Present Illness:    Cynthia [name] is a 46 y.o. female.  Patient has past medical history of nonsustained ventricular tachycardia, essential hypertension dyslipidemia diabetes mellitus.  She denies any problems at this time and takes care of activities of daily living.  No chest pain orthopnea or PND.  No palpitations.  At the time of my evaluation, the patient is alert awake oriented and in no distress.   Past Medical History:  Diagnosis Date   Allergic  rhinitis due to pollen 03/06/2021   Chronic fatigue syndrome 03/06/2021   Chronic pain 03/06/2021   Contact with and (suspected) exposure to other viral communicable diseases 03/06/2021   Diabetes mellitus due to underlying condition with unspecified complications (Cynthia [name]) 06/11/4480   Diabetes mellitus without complication [facility])    Family history of breast cancer 04/04/2021   Genetic testing 04/17/2021   Negative genetic testing on the CancerNext-Expanded+RNAinsight panel.  The CancerNext-Expanded gene panel offered by [facility] and includes sequencing and rearrangement analysis for the following 77 genes: AIP, ALK, APC*, ATM*, AXIN2, BAP1, BARD1, BLM, BMPR1A, BRCA1*, BRCA2*, BRIP1*, CDC73, CDH1*, CDK4, CDKN1B, CDKN2A, CHEK2*, CTNNA1, DICER1, FANCC, FH, FLCN, GALNT12, KIF1B, LZTR1, MAX, MEN1,   Head trauma    History of gestational diabetes mellitus 03/06/2021   Hyperprolactinemia ([place])    Iron deficiency 03/06/2021   Iron deficiency anemia 04/13/2021   Nonsustained ventricular tachycardia ([place]) 09/21/2018   Obesity 03/06/2021   Obesity (BMI 30.0-34.9) 04/13/2021   Palpitations 03/03/2018   Pituitary tumor    is now resolved   Plantar fasciitis of left foot 05/25/2021   Prediabetes    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   Ferrous Sulfate (IRON) 325 (65  Fe) MG TABS Take 1 tablet by mouth daily.   fexofenadine (ALLEGRA) 180 MG tablet Take 180 mg by mouth daily.   levonorgestrel-ethinyl estradiol (ALESSE) 0.1-20 MG-MCG tablet Take 1 tablet by mouth daily at 12 noon.   meclizine (ANTIVERT) 25 MG tablet Take 25 mg by mouth 2 (two) times daily as needed for dizziness.   metFORMIN (GLUCOPHAGE-XR) 500 MG 24 hr tablet Take 2,000 mg by mouth daily.    metoprolol succinate (TOPROL-XL) 50 MG 24 hr tablet Take 1 tablet (50 mg total) by mouth daily.   omeprazole-sodium bicarbonate (ZEGERID) 40-1100 MG capsule Take 1 capsule by mouth daily before breakfast.     Allergies:   Azelastine, Montelukast, Amoxicillin-pot clavulanate, Montelukast sodium, Latex, and Penicillins   Social History   Socioeconomic History   Marital status: Married    Spouse name: Not on file   Number of children: Not on file   Years of education: Not on file   Highest education level: Not on file  Occupational History   Not on file  Tobacco Use   Smoking status: Former   Smokeless tobacco: Never  Vaping Use   Vaping Use: Never used  Substance and Sexual Activity   Alcohol use: Not Currently    Comment: "very rarely"   Drug use: Never   Sexual activity: Not on file  Other Topics Concern   Not on file  Social History Narrative   Not on file   Social Determinants of Health   Financial Resource Strain: Not on file  Food Insecurity: Not on file  Transportation Needs: Not on file  Physical Activity: Not on file  Stress: Not on file  Social Connections: Not on file     Family History: The patient's family history includes Breast cancer (age of onset: 60) in her sister; Cervical cancer (age of onset: 41) in her sister; Dementia in her maternal grandmother; Diabetes in her paternal grandfather; Hypertension in her mother; Lung cancer in her paternal grandfather.  ROS:   Please see the history of present illness.    All other systems reviewed and are negative.  EKGs/Labs/Other Studies Reviewed:    The following studies were reviewed today: EKG reveals sinus rhythm and nonspecific ST-T changes   Recent Labs: 02/06/2022: Hemoglobin 12.5; Platelet Count 233  Recent Lipid Panel    Component Value Date/Time   CHOL 142 05/29/2020 1001   TRIG 89 05/29/2020 1001   HDL 47 05/29/2020 1001   CHOLHDL 3.0 05/29/2020 1001   LDLCALC 78 05/29/2020 1001     Physical Exam:    VS:  BP 120/68   Pulse 76   Ht 5' 4"  (1.626 m)   Wt 170 lb 1.3 oz (77.1 kg)   SpO2 97%   BMI 29.19 kg/m     Wt Readings from Last 3 Encounters:  05/02/22 170 lb 1.3 oz (77.1 kg)  02/06/22 178 lb 14.4 oz (81.1 kg)  09/26/21 178 lb (80.7 kg)     GEN: Patient is in no acute distress HEENT: Normal NECK: No JVD; No carotid bruits LYMPHATICS: No lymphadenopathy CARDIAC: Hear sounds regular, 2/6 systolic murmur at the apex. RESPIRATORY:  Clear to auscultation without rales, wheezing or rhonchi  ABDOMEN: Soft, non-tender, non-distended MUSCULOSKELETAL:  No edema; No deformity  SKIN: Warm and dry NEUROLOGIC:  Alert and oriented x 3 PSYCHIATRIC:  Normal affect   Signed, Jenean Lindau, MD  05/02/2022 9:09 AM    [place]

## 2022-06-01 ENCOUNTER — Emergency Department (HOSPITAL_BASED_OUTPATIENT_CLINIC_OR_DEPARTMENT_OTHER)
Admission: EM | Admit: 2022-06-01 | Discharge: 2022-06-02 | Disposition: A | Discharge status: Home / Self Care | Payer: PRIVATE HEALTH INSURANCE | Attending: Emergency Medicine | Admitting: Emergency Medicine

## 2022-06-01 ENCOUNTER — Encounter (HOSPITAL_BASED_OUTPATIENT_CLINIC_OR_DEPARTMENT_OTHER): Payer: Self-pay | Admitting: Emergency Medicine

## 2022-06-01 ENCOUNTER — Encounter: Payer: Self-pay | Admitting: Hematology and Oncology

## 2022-06-01 ENCOUNTER — Other Ambulatory Visit: Payer: Self-pay

## 2022-06-01 DIAGNOSIS — Z23 Encounter for immunization: Secondary | ICD-10-CM | POA: Diagnosis not present

## 2022-06-01 DIAGNOSIS — S6992XA Unspecified injury of left wrist, hand and finger(s), initial encounter: Secondary | ICD-10-CM | POA: Diagnosis present

## 2022-06-01 DIAGNOSIS — E119 Type 2 diabetes mellitus without complications: Secondary | ICD-10-CM | POA: Insufficient documentation

## 2022-06-01 DIAGNOSIS — S61213A Laceration without foreign body of left middle finger without damage to nail, initial encounter: Secondary | ICD-10-CM | POA: Diagnosis not present

## 2022-06-01 DIAGNOSIS — Y990 Civilian activity done for income or pay: Secondary | ICD-10-CM | POA: Diagnosis not present

## 2022-06-01 DIAGNOSIS — W230XXA Caught, crushed, jammed, or pinched between moving objects, initial encounter: Secondary | ICD-10-CM | POA: Diagnosis not present

## 2022-06-01 DIAGNOSIS — Z7984 Long term (current) use of oral hypoglycemic drugs: Secondary | ICD-10-CM | POA: Diagnosis not present

## 2022-06-01 DIAGNOSIS — Z79899 Other long term (current) drug therapy: Secondary | ICD-10-CM | POA: Diagnosis not present

## 2022-06-01 DIAGNOSIS — Z91040 Latex allergy status: Secondary | ICD-10-CM | POA: Insufficient documentation

## 2022-06-01 NOTE — ED Triage Notes (Signed)
Pt here from work with a small lac to the middle finger on her left hand

## 2022-06-02 MED ORDER — TETANUS-DIPHTH-ACELL PERTUSSIS 5-2.5-18.5 LF-MCG/0.5 IM SUSY
0.5000 mL | PREFILLED_SYRINGE | Freq: Once | INTRAMUSCULAR | Status: AC
  Administered 2022-06-02: 0.5 mL via INTRAMUSCULAR
  Filled 2022-06-02: qty 0.5

## 2022-06-02 MED ORDER — DOXYCYCLINE HYCLATE 100 MG PO CAPS: 100.0000 mg | ORAL_CAPSULE | Freq: Two times a day (BID) | ORAL | 0 refills | Status: AC

## 2022-06-02 NOTE — Discharge Instructions (Addendum)
Do not let your laceration (cut) get wet for the next 48 hours. After that you may allow soapy water to drain down the wound to clean it. Please do not scrub.  To minimize scarring, you can apply a vaseline based ointment for the next 2 weeks and keep it out of direct sun light. After that, you may apply sunscreen for the next several months. Your strips will fall off on their own in 1-2 weeks. Do not peel them off prior. Return if your wound appears to be infected (see laceration care instructions).

## 2022-06-02 NOTE — ED Provider Notes (Signed)
[facility] EMERGENCY DEPT Provider Note  CSN: [id number] Arrival date & time: 06/01/22 1847  Chief Complaint(s) Laceration  HPI [name] is a 46 y.o. female with a past medical history listed below including documented diabetes on metformin but patient reports she is prediabetic.  She presents today for laceration to her left middle finger pad.  She reports that this occurred while trying to close metal doors while at work at [employer].  She is unsure how she sustained the injury but then she might of caught it between 2 parts of the door.  She endorses discomfort to the soft tissue but no bony pain.  Her tetanus is not up-to-date.  She denies any other injuries.  She did report irrigating the wound immediately.  The history is provided by the patient.    Past Medical History Past Medical History:  Diagnosis Date   Allergic rhinitis due to pollen 03/06/2021   Chronic fatigue syndrome 03/06/2021   Chronic pain 03/06/2021   Contact with and (suspected) exposure to other viral communicable diseases 03/06/2021   Diabetes mellitus due to underlying condition with unspecified complications ([place]) 11/20/5091   Diabetes mellitus without complication ([place])    Family history of breast cancer 04/04/2021   Genetic testing 04/17/2021   Negative genetic testing on the CancerNext-Expanded+RNAinsight panel.  The CancerNext-Expanded gene panel offered by [facility] and includes sequencing and rearrangement analysis for the following 77 genes: AIP, ALK, APC*, ATM*, AXIN2, BAP1, BARD1, BLM, BMPR1A, BRCA1*, BRCA2*, BRIP1*, CDC73, CDH1*, CDK4, CDKN1B, CDKN2A, CHEK2*, CTNNA1, DICER1, FANCC, FH, FLCN, GALNT12, KIF1B, LZTR1, MAX, MEN1,   Head trauma    History of gestational diabetes mellitus 03/06/2021   Hyperprolactinemia ([place])    Iron deficiency 03/06/2021   Iron deficiency anemia 04/13/2021   Nonsustained ventricular tachycardia ([place]) 09/21/2018   Obesity 03/06/2021   Obesity (BMI 30.0-34.9)  04/13/2021   Palpitations 03/03/2018   Pituitary tumor    is now resolved   Plantar fasciitis of left foot 05/25/2021   Prediabetes    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   Patient Active Problem List   Diagnosis Date Noted   Plantar fasciitis of left foot 05/25/2021   Genetic testing 04/17/2021   Iron deficiency anemia 04/13/2021   Obesity (BMI 30.0-34.9) 04/13/2021   Family history of breast cancer 04/04/2021   Allergic rhinitis due to pollen 03/06/2021   Chronic fatigue syndrome 03/06/2021   Chronic pain 03/06/2021   Contact with and (suspected) exposure to other viral communicable diseases 03/06/2021   History of gestational diabetes mellitus 03/06/2021   Iron deficiency 03/06/2021   Obesity 03/06/2021   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 Authorizing Provider  doxycycline (VIBRAMYCIN) 100 MG capsule Take 1 capsule (100 mg total) by mouth 2 (two) times daily for 5 days. 06/02/22 06/07/22 Yes Darren Caldron, Grayce Sessions, MD  atorvastatin (LIPITOR) 10 MG tablet Take 1 tablet (10 mg total) by mouth daily. 05/02/22   Revankar, Reita Cliche, MD  cyanocobalamin 1000 MCG tablet Take 1,000 mcg by mouth daily.    [provider]  Ferrous Sulfate (IRON) 325 (65 Fe) MG TABS Take 1 tablet by mouth  daily.    [provider]  fexofenadine (ALLEGRA) 180 MG tablet Take 180 mg by mouth daily.    [provider]  levonorgestrel-ethinyl estradiol (ALESSE) 0.1-20 MG-MCG tablet Take 1 tablet by mouth daily at 12 noon.    [provider]  meclizine (ANTIVERT) 25 MG tablet Take 25 mg by mouth 2  (two) times daily as needed for dizziness.    [provider]  metFORMIN (GLUCOPHAGE-XR) 500 MG 24 hr tablet Take 2,000 mg by mouth daily. 06/25/21   [provider]  metoprolol succinate (TOPROL-XL) 50 MG 24 hr tablet Take 1 tablet (50 mg total) by mouth daily. 11/28/21   Revankar, Reita Cliche, MD  omeprazole-sodium bicarbonate (ZEGERID) 40-1100 MG capsule Take 1 capsule by mouth daily before breakfast.    [provider]                                                                                                                                    Allergies Azelastine, Montelukast, Amoxicillin-pot clavulanate, Montelukast sodium, Latex, and Penicillins  Review of Systems Review of Systems As noted in HPI  Physical Exam Vital Signs  I have reviewed the triage vital signs BP (!) 150/89 (BP Location: Right Arm)   Pulse 75   Temp 98.6 F (37 C)   Resp 20   Ht 5' 4"  (1.626 m)   Wt 77.1 kg   SpO2 100%   BMI 29.18 kg/m   Physical Exam Vitals reviewed.  Constitutional:      General: She is not in acute distress.    Appearance: She is well-developed. She is not diaphoretic.  HENT:     Head: Normocephalic and atraumatic.     Right Ear: External ear normal.     Left Ear: External ear normal.     Nose: Nose normal.  Eyes:     General: No scleral icterus.    Conjunctiva/sclera: Conjunctivae normal.  Neck:     Trachea: Phonation normal.  Cardiovascular:     Rate and Rhythm: Normal rate and regular rhythm.  Pulmonary:     Effort: Pulmonary effort is normal. No respiratory distress.     Breath sounds: No stridor.  Abdominal:     General: There is no distension.  Musculoskeletal:        General: Normal range of motion.     Right hand: Laceration present. No deformity, tenderness or bony tenderness. Normal range of motion. Normal sensation.       Hands:     Cervical back: Normal range of motion.  Neurological:     Mental Status: She is alert and oriented  to person, place, and time.  Psychiatric:        Behavior: Behavior normal.     ED Results and Treatments Labs (all labs ordered are listed, but only abnormal results are displayed) Labs Reviewed - No data to display  EKG  EKG Interpretation  Date/Time:    Ventricular Rate:    PR Interval:    QRS Duration:   QT Interval:    QTC Calculation:   R Axis:     Text Interpretation:         Radiology No results found.  Pertinent labs & imaging results that were available during my care of the patient were reviewed by me and considered in my medical decision making (see MDM for details).  Medications Ordered in ED Medications  Tdap (BOOSTRIX) injection 0.5 mL (0.5 mLs Intramuscular Given 06/02/22 0100)                                                                                                                                     Procedures .Marland KitchenLaceration Repair  Date/Time: 06/02/2022 5:42 AM  Performed by: Fatima Blank, MD Authorized by: Fatima Blank, MD   Consent:    Consent obtained:  Verbal   Consent given by:  Patient   Risks discussed:  Pain, infection and poor wound healing   Alternatives discussed:  No treatment Universal protocol:    Procedure explained and questions answered to patient or proxy's satisfaction: yes     Patient identity confirmed:  Arm band Anesthesia:    Anesthesia method:  None Laceration details:    Location:  Finger   Finger location:  L long finger   Length (cm):  1   Depth (mm):  3 Exploration:    Hemostasis achieved with:  Direct pressure   Wound extent: no fascia violation noted, no foreign bodies/material noted, no tendon damage noted and no underlying fracture noted     Contaminated: no   Treatment:    Area cleansed with:  Povidone-iodine   Amount of cleaning:  Standard   Irrigation solution:   Sterile saline   Irrigation volume:  250cc   Irrigation method:  Pressure wash Skin repair:    Repair method:  Steri-Strips   Number of Steri-Strips:  2 Approximation:    Approximation:  Close Repair type:    Repair type:  Simple Post-procedure details:    Dressing:  Non-adherent dressing   Procedure completion:  Tolerated   (including critical care time)  Medical Decision Making / ED Course    Complexity of Problem:  Co-morbidities/SDOH that complicate the patient evaluation/care: Diabetes   Patient's presenting problem/concern, DDX, and MDM listed below: Left middle finger pad laceration No tenderness that would be concerning for underlying fracture.  No nailbed involvement. Wound irrigated and closed as above Tetanus updated Given her history of diabetes, will provide with prophylactic antibiotic prescription    Final Clinical Impression(s) / ED Diagnoses Final diagnoses:  Laceration of left middle finger without foreign body without damage to nail, initial encounter   The patient appears reasonably screened and/or stabilized for discharge and I doubt any other medical condition or other [facility] requiring further screening, evaluation, or treatment  in the ED at this time prior to discharge. Safe for discharge with strict return precautions.  Disposition: Discharge  Condition: Good  I have discussed the results, Dx and Tx plan with the patient/family who expressed understanding and agree(s) with the plan. Discharge instructions discussed at length. The patient/family was given strict return precautions who verbalized understanding of the instructions. No further questions at time of discharge.    ED Discharge Orders          Ordered    doxycycline (VIBRAMYCIN) 100 MG capsule  2 times daily        06/02/22 0053              Follow Up: Maurice Small, MD [address] [place] [address] [phone]  Call  to schedule an appointment  for close follow up           This chart was dictated using voice recognition software.  Despite best efforts to proofread,  errors can occur which can change the documentation meaning.    Fatima Blank, MD 06/02/22 [phone]

## 2022-06-13 ENCOUNTER — Encounter: Payer: Self-pay | Admitting: Hematology and Oncology

## 2022-07-23 DIAGNOSIS — Z0000 Encounter for general adult medical examination without abnormal findings: Secondary | ICD-10-CM | POA: Diagnosis not present

## 2022-07-23 DIAGNOSIS — R7303 Prediabetes: Secondary | ICD-10-CM | POA: Diagnosis not present

## 2022-07-23 DIAGNOSIS — E559 Vitamin D deficiency, unspecified: Secondary | ICD-10-CM | POA: Diagnosis not present

## 2022-07-23 DIAGNOSIS — D509 Iron deficiency anemia, unspecified: Secondary | ICD-10-CM | POA: Diagnosis not present

## 2022-07-23 DIAGNOSIS — E785 Hyperlipidemia, unspecified: Secondary | ICD-10-CM | POA: Diagnosis not present

## 2022-07-23 DIAGNOSIS — Z5181 Encounter for therapeutic drug level monitoring: Secondary | ICD-10-CM | POA: Diagnosis not present

## 2022-07-23 DIAGNOSIS — Z23 Encounter for immunization: Secondary | ICD-10-CM | POA: Diagnosis not present

## 2022-08-09 ENCOUNTER — Inpatient Hospital Stay: Payer: BC Managed Care – PPO | Admitting: Hematology and Oncology

## 2022-08-09 ENCOUNTER — Inpatient Hospital Stay: Payer: BC Managed Care – PPO

## 2022-08-15 ENCOUNTER — Inpatient Hospital Stay (HOSPITAL_BASED_OUTPATIENT_CLINIC_OR_DEPARTMENT_OTHER): Payer: BC Managed Care – PPO | Admitting: Hematology and Oncology

## 2022-08-15 ENCOUNTER — Encounter: Payer: Self-pay | Admitting: Hematology and Oncology

## 2022-08-15 ENCOUNTER — Other Ambulatory Visit: Payer: Self-pay | Admitting: *Deleted

## 2022-08-15 ENCOUNTER — Inpatient Hospital Stay: Payer: BC Managed Care – PPO | Attending: Hematology and Oncology

## 2022-08-15 DIAGNOSIS — D500 Iron deficiency anemia secondary to blood loss (chronic): Secondary | ICD-10-CM | POA: Diagnosis not present

## 2022-08-15 DIAGNOSIS — E611 Iron deficiency: Secondary | ICD-10-CM

## 2022-08-15 DIAGNOSIS — D509 Iron deficiency anemia, unspecified: Secondary | ICD-10-CM | POA: Diagnosis not present

## 2022-08-15 LAB — CBC WITH DIFFERENTIAL (CANCER CENTER ONLY)
Abs Immature Granulocytes: 0.01 10*3/uL (ref 0.00–0.07)
Basophils Absolute: 0.1 10*3/uL (ref 0.0–0.1)
Basophils Relative: 1 %
Eosinophils Absolute: 0.1 10*3/uL (ref 0.0–0.5)
Eosinophils Relative: 1 %
HCT: 37.6 % (ref 36.0–46.0)
Hemoglobin: 12.6 g/dL (ref 12.0–15.0)
Immature Granulocytes: 0 %
Lymphocytes Relative: 37 %
Lymphs Abs: 3.3 10*3/uL (ref 0.7–4.0)
MCH: 30.4 pg (ref 26.0–34.0)
MCHC: 33.5 g/dL (ref 30.0–36.0)
MCV: 90.8 fL (ref 80.0–100.0)
Monocytes Absolute: 0.5 10*3/uL (ref 0.1–1.0)
Monocytes Relative: 5 %
Neutro Abs: 4.9 10*3/uL (ref 1.7–7.7)
Neutrophils Relative %: 56 %
Platelet Count: 235 10*3/uL (ref 150–400)
RBC: 4.14 MIL/uL (ref 3.87–5.11)
RDW: 12.9 % (ref 11.5–15.5)
WBC Count: 8.8 10*3/uL (ref 4.0–10.5)
nRBC: 0 % (ref 0.0–0.2)

## 2022-08-15 LAB — CMP (CANCER CENTER ONLY)
ALT: 19 U/L (ref 0–44)
AST: 13 U/L — ABNORMAL LOW (ref 15–41)
Albumin: 4.2 g/dL (ref 3.5–5.0)
Alkaline Phosphatase: 68 U/L (ref 38–126)
Anion gap: 5 (ref 5–15)
BUN: 12 mg/dL (ref 6–20)
CO2: 28 mmol/L (ref 22–32)
Calcium: 9 mg/dL (ref 8.9–10.3)
Chloride: 105 mmol/L (ref 98–111)
Creatinine: 0.64 mg/dL (ref 0.44–1.00)
GFR, Estimated: >60 mL/min (ref >60)
Glucose, Bld: 92 mg/dL (ref 70–99)
Potassium: 3.9 mmol/L (ref 3.5–5.1)
Sodium: 138 mmol/L (ref 135–145)
Total Bilirubin: 0.8 mg/dL (ref 0.3–1.2)
Total Protein: 7.2 g/dL (ref 6.5–8.1)

## 2022-08-15 LAB — FERRITIN: Ferritin: 70 ng/mL (ref 11–307)

## 2022-08-15 LAB — IRON AND IRON BINDING CAPACITY (CC-WL,HP ONLY)
Iron: 121 ug/dL (ref 28–170)
Saturation Ratios: 29 % (ref 10.4–31.8)
TIBC: 421 ug/dL (ref 250–450)
UIBC: 300 ug/dL (ref 148–442)

## 2022-08-15 NOTE — Progress Notes (Signed)
[facility] NOTE  Patient Care Team: Maurice Small, [name]  CHIEF COMPLAINTS/PURPOSE OF CONSULTATION:  IDA, possible need for IV iron infusion  ASSESSMENT & PLAN:   Iron deficiency anemia This is a very pleasant 46 year old female patient with no significant past medical history except for prolactinoma and prediabetes, iron deficiency referred to hematology for evaluation and recommendations regarding her iron deficiency anemia.  She has been doing really well.  No concerns or questions today.  Physical examination unremarkable.  CBC reviewed, anemia has resolved, hemoglobin has been stable for the past 6 months.  Iron panel pending at the time of my visit.  At this time since she has been doing really well with no new concerns, she can return to clinic in 1 year for follow-up or sooner as needed.  HISTORY OF PRESENTING ILLNESS:   Caren J Zale 46 y.o. female is here because of IDA  HPI  This is a very pleasant 46 year old female patient with past medical history significant for prediabetes, prolactinoma, chronic fatigue referred to hematology for evaluation and recommendations for iron deficiency. We initially tried oral iron for about 8 weeks but she continued to complain of worsening fatigue hence we have given her a trial of intravenous iron and she is here for follow-up.  Interval history  Ms. Lafosse is here for follow-up.  Since last visit, she denies any complaints.  She feels really well.  She has regular menstrual cycles.  No hematochezia or melena.  Rest of the pertinent 10 point ROS reviewed and negative.  MEDICAL HISTORY:  Past Medical History:  Diagnosis Date   Allergic rhinitis due to pollen 03/06/2021   Chronic fatigue syndrome 03/06/2021   Chronic pain 03/06/2021   Contact with and (suspected) exposure to other viral communicable diseases 03/06/2021   Diabetes mellitus due to underlying condition with unspecified  complications ([place]) 05/19/4695   Diabetes mellitus without complication ([place])    Family history of breast cancer 04/04/2021   Genetic testing 04/17/2021   Negative genetic testing on the CancerNext-Expanded+RNAinsight panel.  The CancerNext-Expanded gene panel offered by [facility] and includes sequencing and rearrangement analysis for the following 77 genes: AIP, ALK, APC*, ATM*, AXIN2, BAP1, BARD1, BLM, BMPR1A, BRCA1*, BRCA2*, BRIP1*, CDC73, CDH1*, CDK4, CDKN1B, CDKN2A, CHEK2*, CTNNA1, DICER1, FANCC, FH, FLCN, GALNT12, KIF1B, LZTR1, MAX, MEN1,   Head trauma    History of gestational diabetes mellitus 03/06/2021   Hyperprolactinemia ([place])    Iron deficiency 03/06/2021   Iron deficiency anemia 04/13/2021   Nonsustained ventricular tachycardia ([place]) 09/21/2018   Obesity 03/06/2021   Obesity (BMI 30.0-34.9) 04/13/2021   Palpitations 03/03/2018   Pituitary tumor    is now resolved   Plantar fasciitis of left foot 05/25/2021   Prediabetes    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    SURGICAL HISTORY: Past Surgical History:  Procedure Laterality Date   CESAREAN SECTION      SOCIAL HISTORY: Social History   Socioeconomic History   Marital status: Married    Spouse name: Not on file   Number of children: Not on file   Years of education: Not on file   Highest education level: Not on file  Occupational History   Not on file  Tobacco Use   Smoking status: Former   Smokeless tobacco: Never  Vaping Use   Vaping Use: Never used  Substance and Sexual Activity   Alcohol use: Not Currently    Comment: "very rarely"   Drug use:  Never   Sexual activity: Not on file  Other Topics Concern   Not on file  Social History Narrative   Not on file   Social Determinants of Health   Financial Resource Strain: Not on file  Food Insecurity: Not on file  Transportation Needs: Not on file  Physical Activity: Not on file  Stress: Not on file  Social Connections: Not on file  Intimate Partner Violence: Not  on file    FAMILY HISTORY: Family History  Problem Relation Age of Onset   Hypertension Mother    Cervical cancer Sister 50   Breast cancer Sister 62   Diabetes Paternal Grandfather    Lung cancer Paternal Grandfather    Dementia Maternal Grandmother     ALLERGIES:  is allergic to azelastine, montelukast, amoxicillin-pot clavulanate, montelukast sodium, latex, and penicillins.  MEDICATIONS:  Current Outpatient Medications  Medication Sig Dispense Refill   atorvastatin (LIPITOR) 10 MG tablet Take 1 tablet (10 mg total) by mouth daily. 90 tablet 3   cyanocobalamin 1000 MCG tablet Take 1,000 mcg by mouth daily.     Ferrous Sulfate (IRON) 325 (65 Fe) MG TABS Take 1 tablet by mouth daily.     fexofenadine (ALLEGRA) 180 MG tablet Take 180 mg by mouth daily.     levonorgestrel-ethinyl estradiol (ALESSE) 0.1-20 MG-MCG tablet Take 1 tablet by mouth daily at 12 noon.     meclizine (ANTIVERT) 25 MG tablet Take 25 mg by mouth 2 (two) times daily as needed for dizziness.     metFORMIN (GLUCOPHAGE-XR) 500 MG 24 hr tablet Take 2,000 mg by mouth daily.     metoprolol succinate (TOPROL-XL) 50 MG 24 hr tablet Take 1 tablet (50 mg total) by mouth daily. 90 tablet 2   omeprazole-sodium bicarbonate (ZEGERID) 40-1100 MG capsule Take 1 capsule by mouth daily before breakfast.     No current facility-administered medications for this visit.    PHYSICAL EXAMINATION:  ECOG PERFORMANCE STATUS: 1 - Symptomatic but completely ambulatory  Vitals:   08/15/22 1225  BP: 129/78  Pulse: 75  Resp: 14  Temp: 97.9 F (36.6 C)  SpO2: 98%    Filed Weights   08/15/22 1225  Weight: 171 lb 1.6 oz (77.6 kg)   Physical Exam Constitutional:      Appearance: Normal appearance.  Cardiovascular:     Rate and Rhythm: Normal rate and regular rhythm.     Pulses: Normal pulses.     Heart sounds: Normal heart sounds.  Pulmonary:     Effort: Pulmonary effort is normal.     Breath sounds: Normal breath sounds.   Abdominal:     General: Abdomen is flat. Bowel sounds are normal.     Palpations: Abdomen is soft.  Musculoskeletal:        General: Normal range of motion.     Cervical back: Normal range of motion and neck supple. No rigidity.  Lymphadenopathy:     Cervical: No cervical adenopathy.  Skin:    General: Skin is warm and dry.  Neurological:     General: No focal deficit present.     Mental Status: She is alert.  Psychiatric:        Mood and Affect: Mood normal.      LABORATORY DATA:  I have reviewed the data as listed Lab Results  Component Value Date   WBC 8.8 08/15/2022   HGB 12.6 08/15/2022   HCT 37.6 08/15/2022   MCV 90.8 08/15/2022   PLT 235 08/15/2022  Chemistry      Component Value Date/Time   NA 138 08/15/2022 1209   NA 141 04/11/2020 0932   K 3.9 08/15/2022 1209   CL 105 08/15/2022 1209   CO2 28 08/15/2022 1209   BUN 12 08/15/2022 1209   BUN 14 04/11/2020 0932   CREATININE 0.64 08/15/2022 1209      Component Value Date/Time   CALCIUM 9.0 08/15/2022 1209   ALKPHOS 68 08/15/2022 1209   AST 13 (L) 08/15/2022 1209   ALT 19 08/15/2022 1209   BILITOT 0.8 08/15/2022 1209     CBC from today reviewed and within normal limits.  Iron panel pending at the time of my visit.  RADIOGRAPHIC STUDIES: I have personally reviewed the radiological images as listed and agreed with the findings in the report. No results found.  All questions were answered. The patient knows to call the clinic with any problems, questions or concerns. I spent 15 minutes in the care of this patient including H and P, review of records, counseling and coordination of care.     Benay Pike, MD 08/15/2022 2:42 PM

## 2022-08-15 NOTE — Assessment & Plan Note (Signed)
This is a very pleasant 46 year old female patient with no significant past medical history except for prolactinoma and prediabetes, iron deficiency referred to hematology for evaluation and recommendations regarding her iron deficiency anemia.  She has been doing really well.  No concerns or questions today.  Physical examination unremarkable.  CBC reviewed, anemia has resolved, hemoglobin has been stable for the past 6 months.  Iron panel pending at the time of my visit.  At this time since she has been doing really well with no new concerns, she can return to clinic in 1 year for follow-up or sooner as needed.

## 2022-11-01 ENCOUNTER — Other Ambulatory Visit (HOSPITAL_COMMUNITY)
Admission: RE | Admit: 2022-11-01 | Discharge: 2022-11-01 | Disposition: A | Discharge status: Home / Self Care | Payer: BC Managed Care – PPO | Source: Ambulatory Visit | Attending: Obstetrics and Gynecology | Admitting: Obstetrics and Gynecology

## 2022-11-01 ENCOUNTER — Other Ambulatory Visit: Payer: Self-pay | Admitting: Obstetrics and Gynecology

## 2022-11-01 DIAGNOSIS — Z01419 Encounter for gynecological examination (general) (routine) without abnormal findings: Secondary | ICD-10-CM | POA: Diagnosis not present

## 2022-11-01 DIAGNOSIS — N3941 Urge incontinence: Secondary | ICD-10-CM | POA: Diagnosis not present

## 2022-11-06 LAB — CYTOLOGY - PAP
Comment: NEGATIVE
Diagnosis: NEGATIVE
High risk HPV: NEGATIVE

## 2022-11-21 ENCOUNTER — Other Ambulatory Visit: Payer: Self-pay | Admitting: Cardiology

## 2022-11-27 DIAGNOSIS — J018 Other acute sinusitis: Secondary | ICD-10-CM | POA: Diagnosis not present

## 2022-12-02 IMAGING — US US THYROID
1 series · 13 of 25 positions shown · non-contrast
Comparison: None.

CLINICAL DATA: Left neck pain for 2 weeks.

EXAM:
THYROID ULTRASOUND
TECHNIQUE: Ultrasound examination of the thyroid gland and adjacent soft
tissues was performed.

[Series 1: us thyroid · 0.05mm/px · 13 of 40 slices shown]
[im 1/40]
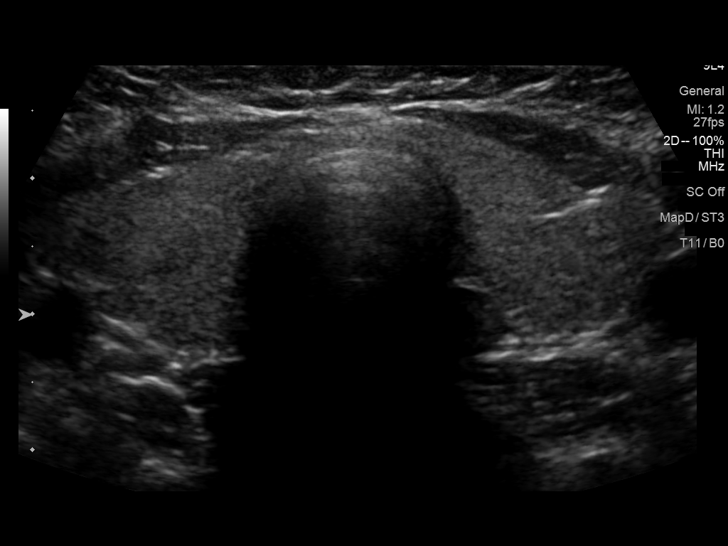
[im 4/40]
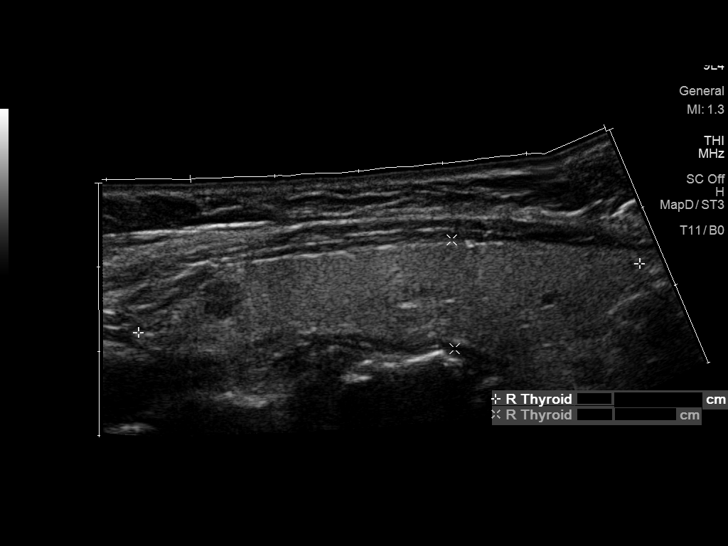
[im 7/40]
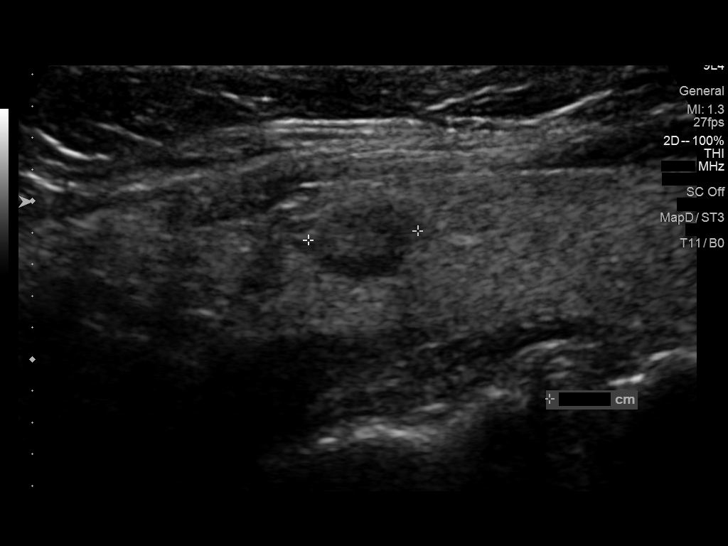
[im 10/40]
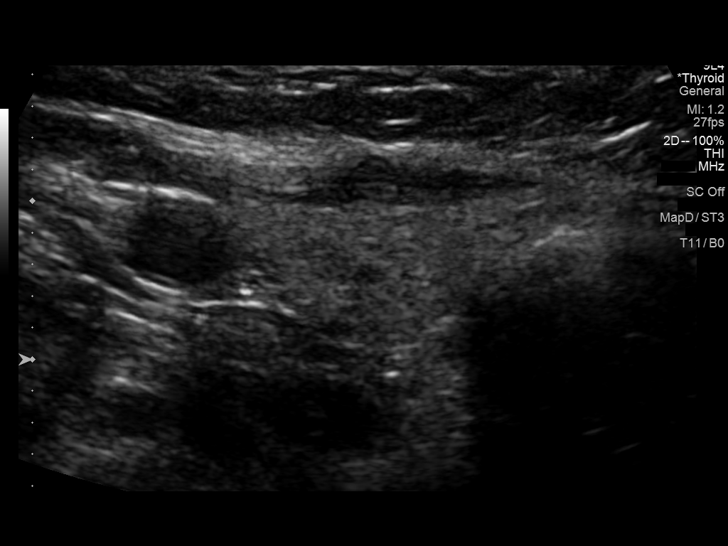
[im 14/40]
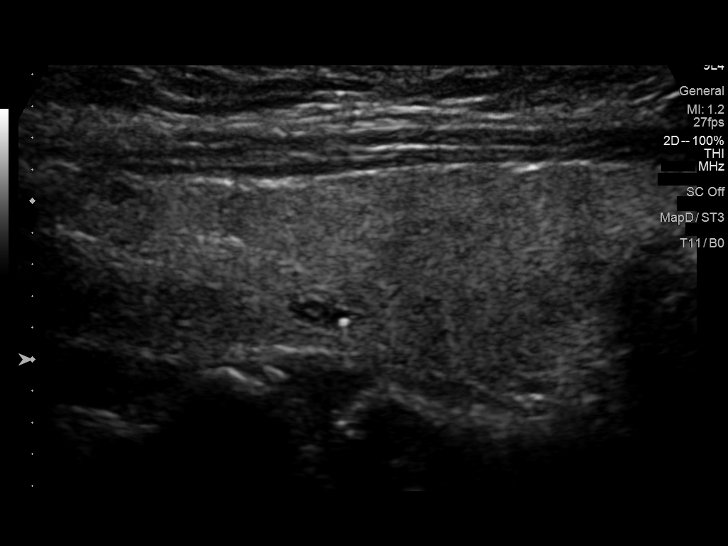
[im 17/40]
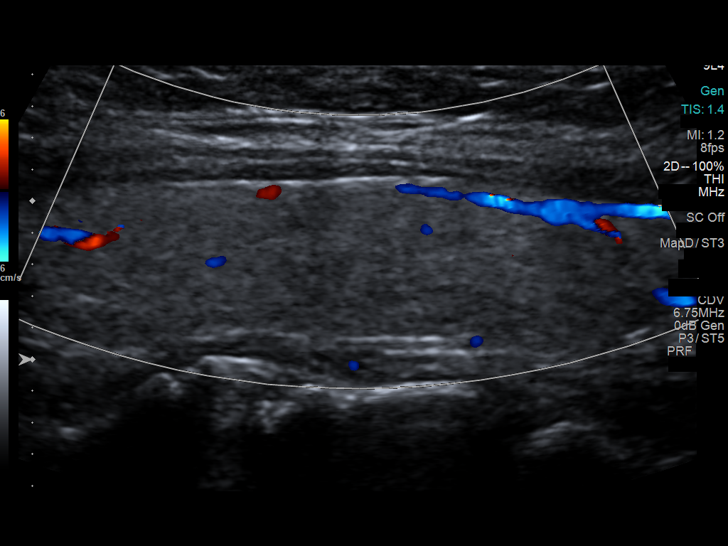
[im 20/40]
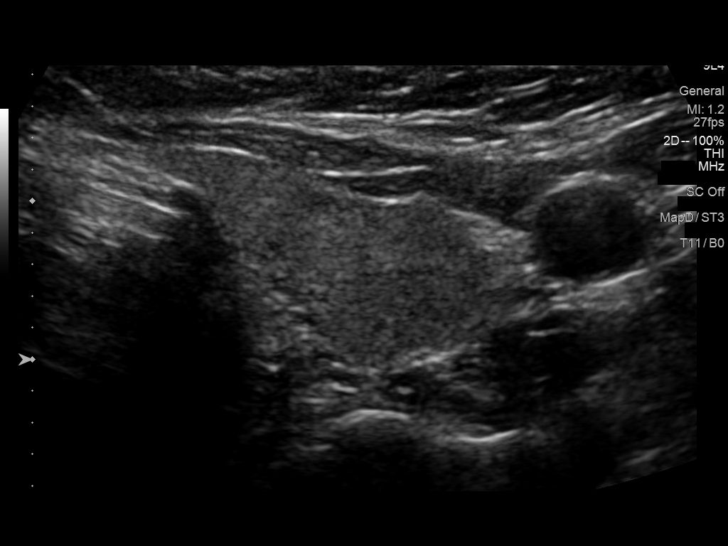
[im 23/40]
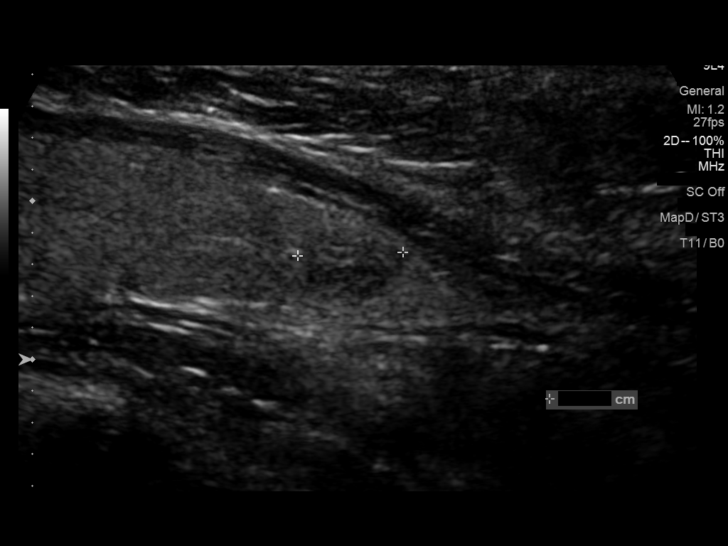
[im 27/40]
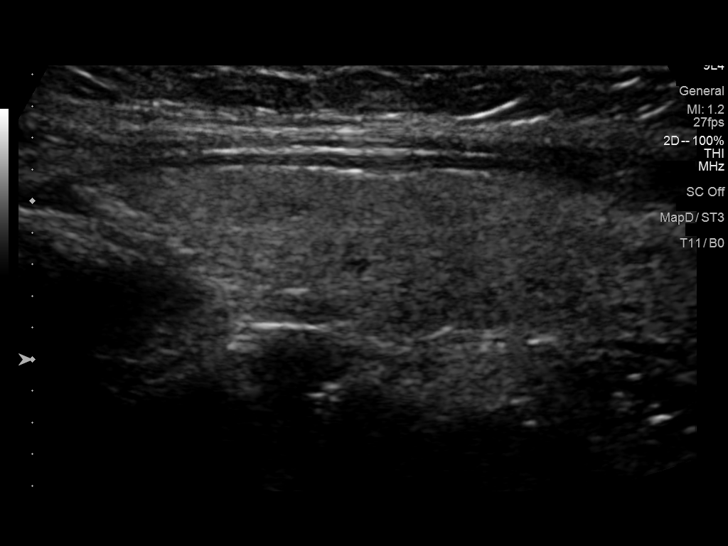
[im 30/40]
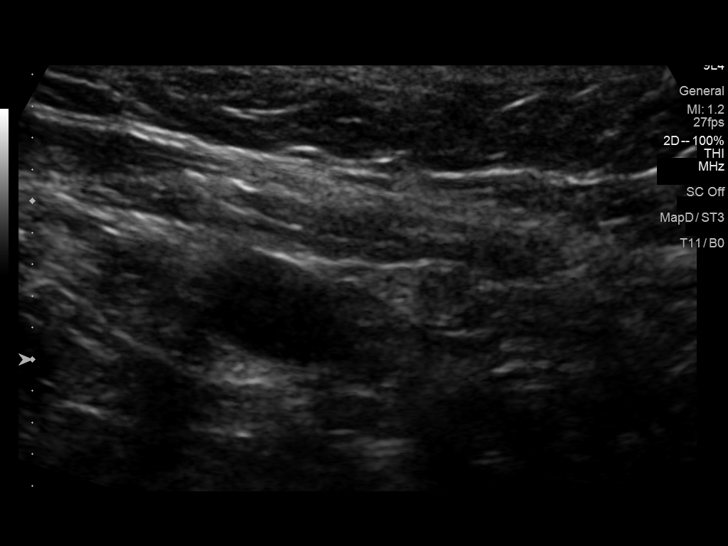
[im 33/40]
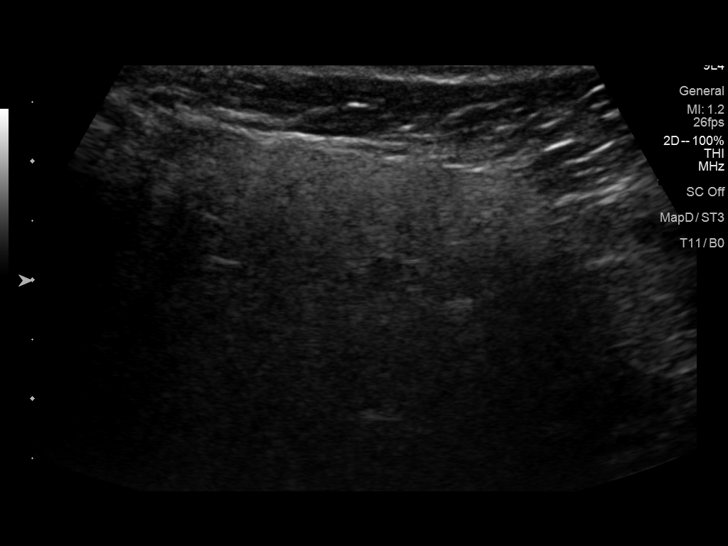
[im 36/40]
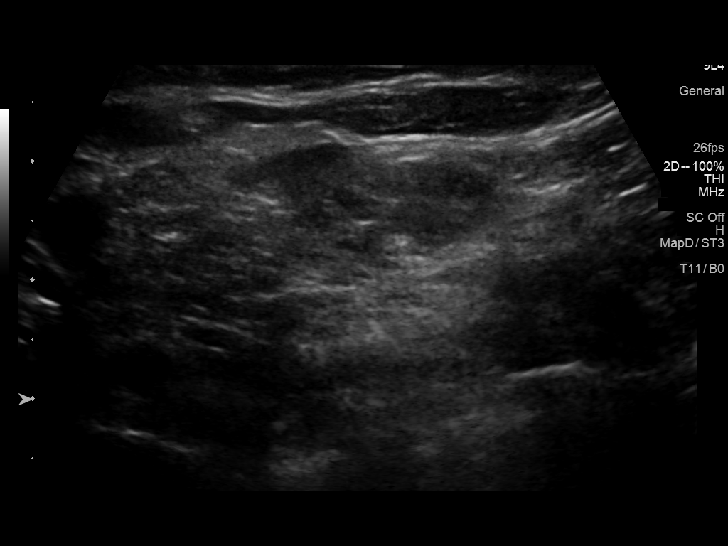
[im 40/40]
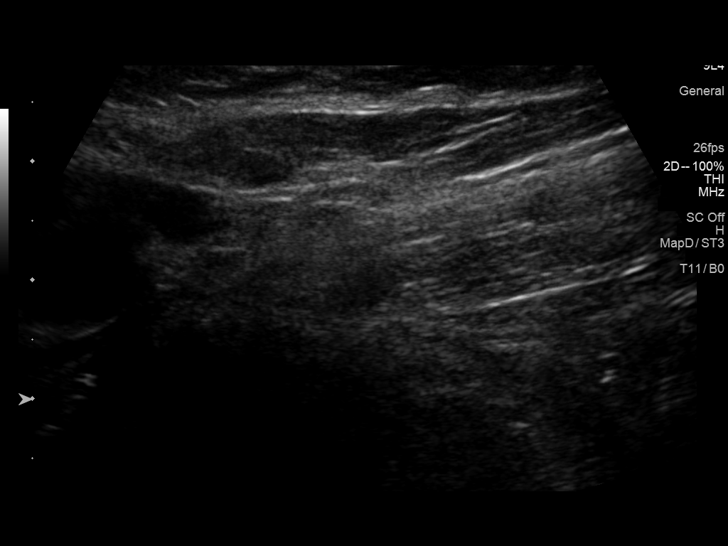

[13 of 25 positions shown; findings below may reference images not displayed]

FINDINGS: Parenchymal Echotexture: Normal

Isthmus: 0.3 cm

Right lobe: 6.0 x 1.3 x 1.8 cm

Left lobe: 5.8 x 1.1 x 1.8 cm

_________________________________________________________

Estimated total number of nodules >/= 1 cm: 0

Number of spongiform nodules >/=  2 cm not described below (TR1): 0

Number of mixed cystic and solid nodules >/= 1.5 cm not described
below (TR2): 0

_________________________________________________________

Nodule # 1:

Location: Right; Superior

Maximum size: 0.7 cm; Other 2 dimensions: 0.5 x 0.7 cm

Composition: solid/almost completely solid (2)

Echogenicity: hypoechoic (2)

Shape: not taller-than-wide (0)

Margins: ill-defined (0)

Echogenic foci: none (0)

ACR TI-RADS total points: 4.

ACR TI-RADS risk category: TR4 (4-6 points).

ACR TI-RADS recommendations:

Given size (<0.9 cm) and appearance, this nodule does NOT meet
TI-RADS criteria for biopsy or dedicated follow-up.

_________________________________________________________

Nodule # 2:

Location: Left; Inferior

Maximum size: 0.7 cm; Other 2 dimensions: 0.4 x 0.5 cm

Composition: solid/almost completely solid (2)

Echogenicity: hypoechoic (2)

Shape: not taller-than-wide (0)

Margins: ill-defined (0)

Echogenic foci: none (0)

ACR TI-RADS total points: 4.

ACR TI-RADS risk category: TR4 (4-6 points).

ACR TI-RADS recommendations:

Given size (<0.9 cm) and appearance, this nodule does NOT meet
TI-RADS criteria for biopsy or dedicated follow-up.

_________________________________________________________

No enlarged lymph nodes.
IMPRESSION: Small thyroid nodules. These nodules do not meet criteria for biopsy
or dedicated follow-up.

The above is in keeping with the ACR TI-RADS recommendations - [HOSPITAL] 3171;[DATE].

## 2023-01-09 ENCOUNTER — Ambulatory Visit
Admission: RE | Admit: 2023-01-09 | Discharge: 2023-01-09 | Disposition: A | Discharge status: Home / Self Care | Payer: BC Managed Care – PPO | Source: Ambulatory Visit | Attending: Family Medicine | Admitting: Family Medicine

## 2023-01-09 ENCOUNTER — Other Ambulatory Visit: Payer: Self-pay | Admitting: Family Medicine

## 2023-01-09 DIAGNOSIS — M79645 Pain in left finger(s): Secondary | ICD-10-CM

## 2023-01-09 DIAGNOSIS — M19042 Primary osteoarthritis, left hand: Secondary | ICD-10-CM | POA: Diagnosis not present

## 2023-01-13 DIAGNOSIS — J019 Acute sinusitis, unspecified: Secondary | ICD-10-CM | POA: Diagnosis not present

## 2023-01-13 DIAGNOSIS — Z03818 Encounter for observation for suspected exposure to other biological agents ruled out: Secondary | ICD-10-CM | POA: Diagnosis not present

## 2023-01-13 DIAGNOSIS — R051 Acute cough: Secondary | ICD-10-CM | POA: Diagnosis not present

## 2023-01-13 DIAGNOSIS — J029 Acute pharyngitis, unspecified: Secondary | ICD-10-CM | POA: Diagnosis not present

## 2023-01-13 DIAGNOSIS — R6883 Chills (without fever): Secondary | ICD-10-CM | POA: Diagnosis not present

## 2023-02-15 ENCOUNTER — Other Ambulatory Visit: Payer: Self-pay | Admitting: Cardiology

## 2023-02-17 DIAGNOSIS — E221 Hyperprolactinemia: Secondary | ICD-10-CM | POA: Diagnosis not present

## 2023-02-17 DIAGNOSIS — R7303 Prediabetes: Secondary | ICD-10-CM | POA: Diagnosis not present

## 2023-02-17 DIAGNOSIS — E669 Obesity, unspecified: Secondary | ICD-10-CM | POA: Diagnosis not present

## 2023-03-09 IMAGING — DX DG FOOT 2V*L*
2 series · 2 of 2 positions shown · non-contrast
Comparison: None

CLINICAL DATA: Marked foot pain for 2 weeks. In the area of the
calcaneus.

EXAM:
LEFT FOOT - 2 VIEW

[dg foot 2 views left (1 of 2)]
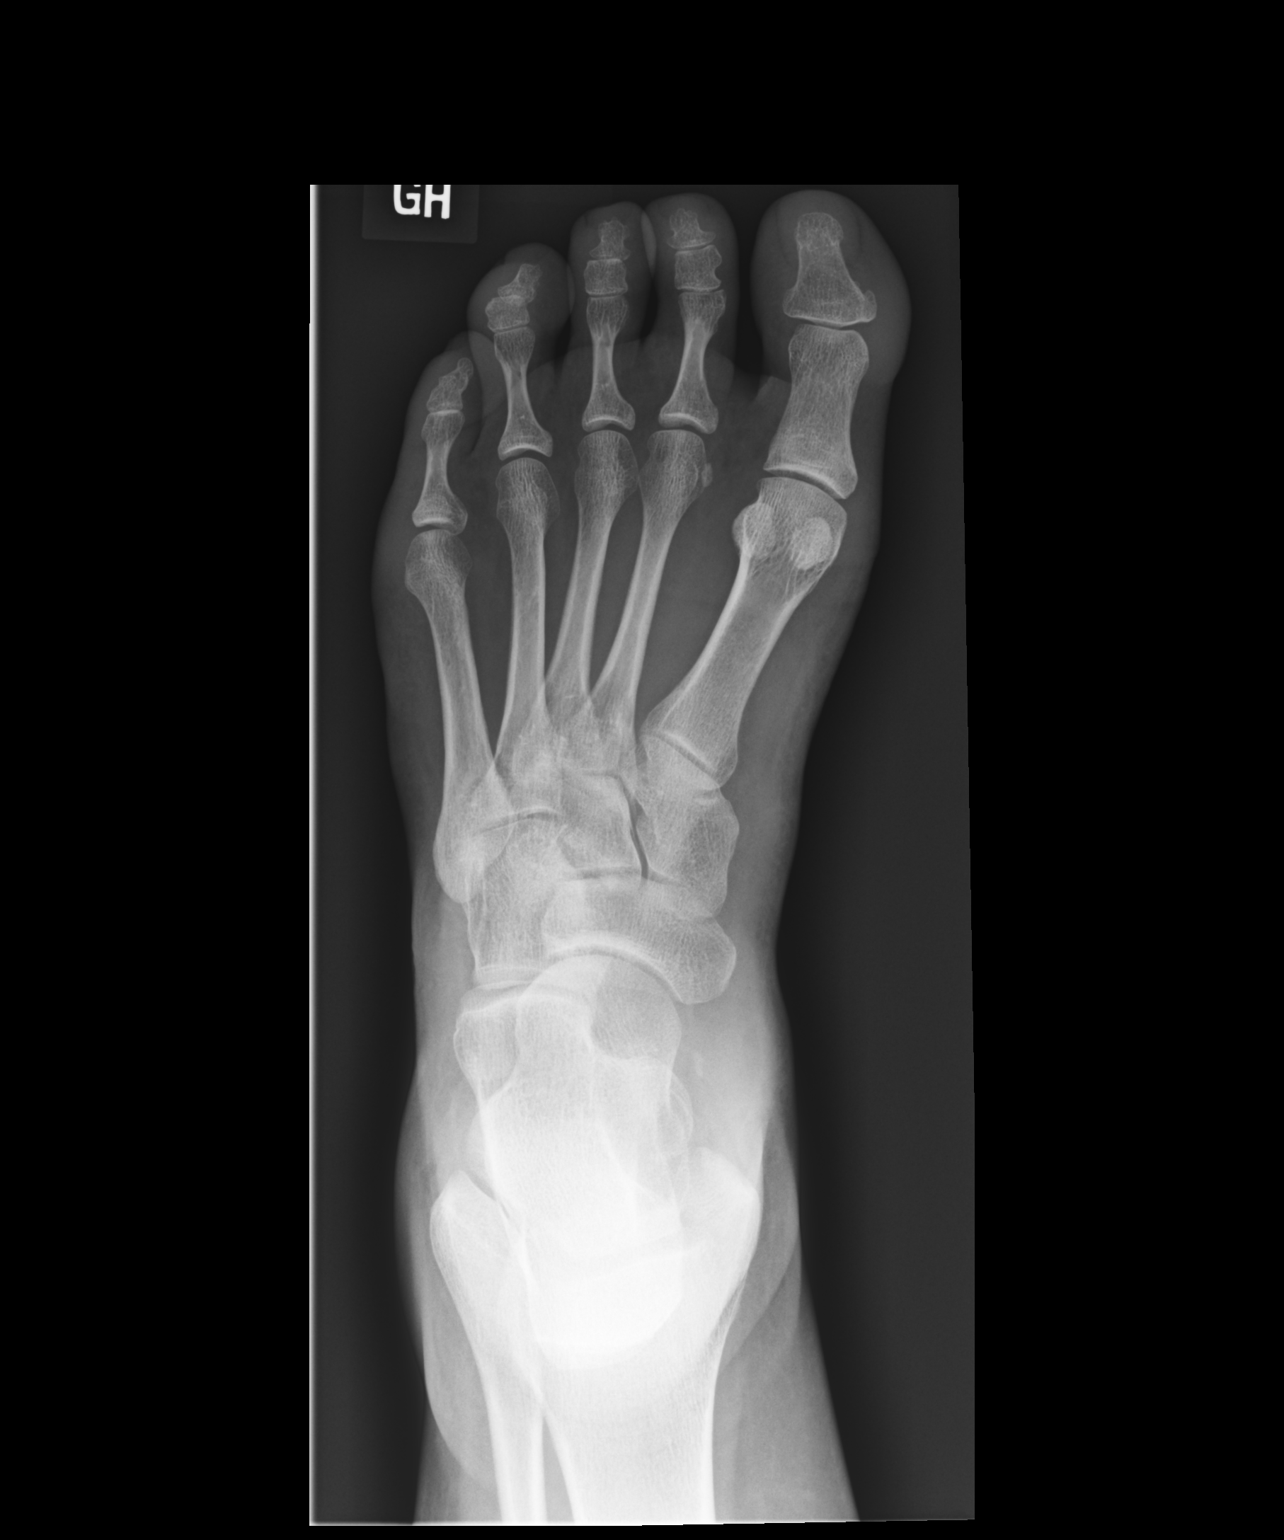

[dg foot 2 views left (2 of 2)]
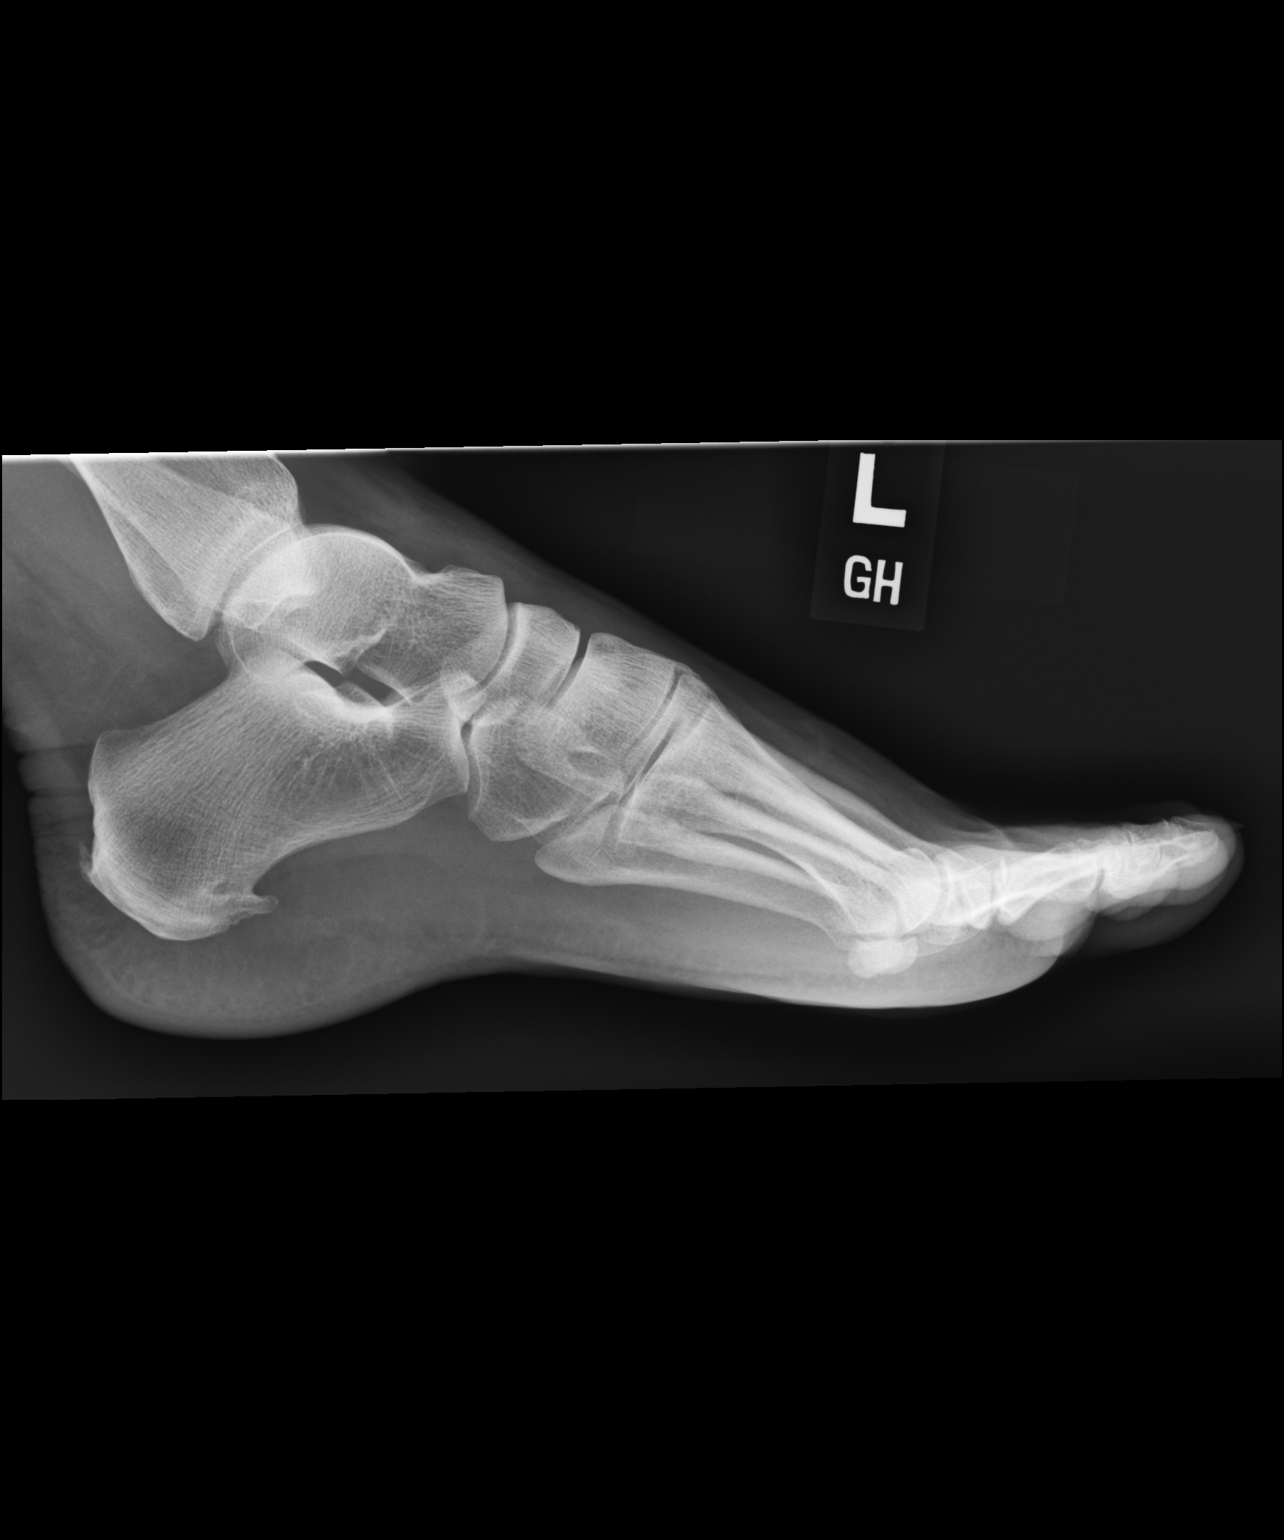

[2 of 2 positions shown; findings below may reference images not displayed]

FINDINGS: No sign of fracture or dislocation. Spurring upon the calcaneus
greatest at the plantar surface shin. Mild spurring of the Achilles
insertion upon the calcaneus. Soft tissues are unremarkable.
IMPRESSION: Mild spurring upon the calcaneus, no signs of fracture or
dislocation.

## 2023-04-13 ENCOUNTER — Other Ambulatory Visit: Payer: Self-pay | Admitting: Cardiology

## 2023-04-27 DIAGNOSIS — J029 Acute pharyngitis, unspecified: Secondary | ICD-10-CM | POA: Diagnosis not present

## 2023-05-06 DIAGNOSIS — M79644 Pain in right finger(s): Secondary | ICD-10-CM | POA: Diagnosis not present

## 2023-05-10 ENCOUNTER — Other Ambulatory Visit: Payer: Self-pay | Admitting: Cardiology

## 2023-05-14 DIAGNOSIS — R42 Dizziness and giddiness: Secondary | ICD-10-CM | POA: Diagnosis not present

## 2023-05-26 ENCOUNTER — Encounter: Payer: Self-pay | Admitting: Cardiology

## 2023-05-26 ENCOUNTER — Other Ambulatory Visit: Payer: Self-pay | Admitting: Cardiology

## 2023-05-26 MED ORDER — METOPROLOL SUCCINATE ER 50 MG PO TB24: 50.0000 mg | ORAL_TABLET | Freq: Every day | ORAL | 0 refills | Status: DC

## 2023-05-30 NOTE — Therapy (Signed)
OUTPATIENT PHYSICAL THERAPY VESTIBULAR EVALUATION     Patient Name: Cynthia [name] MRN: [id number] DOB:01/02/1976, 47 y.o., female Today's Date: 06/02/2023  END OF SESSION:  PT End of Session - 06/02/23 1013     Visit Number 1    Number of Visits 1    Date for PT Re-Evaluation 06/02/23    Authorization Type BC[employer]    Authorization - Number of Visits 20    PT Start Time [phone]    PT Stop Time 1012    PT Time Calculation (min) 38 min    Activity Tolerance Patient tolerated treatment well    Behavior During Therapy [facility] for tasks assessed/performed             Past Medical History:  Diagnosis Date   Allergic rhinitis due to pollen 03/06/2021   Chronic fatigue syndrome 03/06/2021   Chronic pain 03/06/2021   Contact with and (suspected) exposure to other viral communicable diseases 03/06/2021   Diabetes mellitus due to underlying condition with unspecified complications (HCC) 02/17/2020   Diabetes mellitus without complication (HCC)    Family history of breast cancer 04/04/2021   Genetic testing 04/17/2021   Negative genetic testing on the CancerNext-Expanded+RNAinsight panel.  The CancerNext-Expanded gene panel offered by [id number] and includes sequencing and rearrangement analysis for the following 77 genes: AIP, ALK, APC*, ATM*, AXIN2, BAP1, BARD1, BLM, BMPR1A, BRCA1*, BRCA2*, BRIP1*, CDC73, CDH1*, CDK4, CDKN1B, CDKN2A, CHEK2*, CTNNA1, DICER1, FANCC, FH, FLCN, GALNT12, KIF1B, LZTR1, MAX, MEN1,   Head trauma    History of gestational diabetes mellitus 03/06/2021   Hyperprolactinemia (HCC)    Iron deficiency 03/06/2021   Iron deficiency anemia 04/13/2021   Nonsustained ventricular tachycardia (HCC) 09/21/2018   Obesity 03/06/2021   Obesity (BMI 30.0-34.9) 04/13/2021   Palpitations 03/03/2018   Pituitary tumor    is now resolved   Plantar fasciitis of left foot 05/25/2021   Prediabetes    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   Past Surgical History:  Procedure Laterality Date   CESAREAN  SECTION     Patient Active Problem List   Diagnosis Date Noted   Plantar fasciitis of left foot 05/25/2021   Genetic testing 04/17/2021   Iron deficiency anemia 04/13/2021   Obesity (BMI 30.0-34.9) 04/13/2021   Family history of breast cancer 04/04/2021   Allergic rhinitis due to pollen 03/06/2021   Chronic fatigue syndrome 03/06/2021   Chronic pain 03/06/2021   Contact with and (suspected) exposure to other viral communicable diseases 03/06/2021   History of gestational diabetes mellitus 03/06/2021   Iron deficiency 03/06/2021   Obesity 03/06/2021   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   Diabetes mellitus without complication (HCC)    Prediabetes    Diabetes mellitus due to underlying condition with unspecified complications (HCC) 02/17/2020   Nonsustained ventricular tachycardia (HCC) 09/21/2018   Palpitations 03/03/2018   Pituitary tumor    Hyperprolactinemia (HCC)    Head trauma     PCP: Shirlean Mylar, MD  REFERRING PROVIDER: Camie Patience, FNP  REFERRING DIAG: R42 (ICD-10-CM) - Dizziness and giddiness  THERAPY DIAG:  Dizziness and giddiness  ONSET DATE: 1 month   Rationale for Evaluation and Treatment: Rehabilitation  SUBJECTIVE:   SUBJECTIVE STATEMENT: Patient reports dizziness off and on for about 5 years. Last month it woke her up in the middle of the night, felt the room moving and head felt heavy. This went on for a week. Has gotten better. Worse with looking overhead, quick head turns. No longer feeling it with bed mobility. Episodes  last seconds. Denies head trauma, infection/illness, vision changes/double vision, hearing loss, tinnitus, otalgia. Reports baseline tinnitus, reports "only 2 migraines in my lifetime." Denies changes in balance.   Pt accompanied by: self  PERTINENT HISTORY: DM, head trauma, anemia, ventricular tach, pituitary tumor  PAIN:  Are you having pain? No  PRECAUTIONS: None  RED FLAGS: None   WEIGHT BEARING RESTRICTIONS: No  FALLS:  Has patient fallen in last 6 months? No  LIVING ENVIRONMENT: Lives with: lives with their spouse and 3 kids Lives in: House/apartment Stairs: No Has following equipment at home: None  PLOF: Independent; full time job requiring ~20lbs lifting (d/t hx LBP), standing, walking  PATIENT GOALS: improve dizziness  OBJECTIVE:   DIAGNOSTIC FINDINGS: none recent  COGNITION: Overall cognitive status: Within functional limits for tasks assessed   SENSATION: Denies N/T in UEs/LEs    POSTURE:  rounded shoulders and forward head  GAIT: Gait pattern: WFL   PATIENT SURVEYS:  FOTO not performed  VESTIBULAR ASSESSMENT:  GENERAL OBSERVATION: pt wears bifocals   OCULOMOTOR EXAM:  Ocular Alignment:  slight L hypertropia  Ocular ROM: No Limitations  Spontaneous Nystagmus: absent  Gaze-Induced Nystagmus: absent  Smooth Pursuits: intact  Saccades: intact  Convergence/Divergence: 2 cm    VESTIBULAR - OCULAR REFLEX:   Slow VOR: visible retinal slip in B planes but pt asymptomatic   VOR Cancellation: Normal  Head-Impulse Test: HIT Right: negative HIT Left: slightly positive      POSITIONAL TESTING:  Right Roll Test: negative Left Roll Test: negative  Right Dix-Hallpike:  L upbeating torsional nystagmus without dizziness; Duration:20 sec Left Dix-Hallpike: L upbeating torsional nystagmus without dizziness; Duration: 40 sec  Right Sidelying: negative; mild dizziness upon sitting up  Left Sidelying: possible very small amplitude L torsional upbeating nystagmus but pt asymptomatic   VESTIBULAR TREATMENT:                                                                                                   DATE: 06/02/23   PATIENT EDUCATION: Education details: prognosis, POC, HEP, 30 day hold; edu on Meclizine as vestibular suppressant Person educated: Patient Education method: Explanation, Demonstration, Tactile cues, Verbal cues, and Handouts Education comprehension: verbalized  understanding and returned demonstration  HOME EXERCISE PROGRAM: Access Code: [email or web address] URL: [email or web address] Date: 06/02/2023 Prepared by: [facility] - Outpatient  Rehab - [facility]  Exercises - Brandt-Daroff Vestibular Exercise  - 1 x daily - 5 x weekly - 2 sets - 3-5 reps - Standing Gaze Stabilization with Head Rotation  - 1 x daily - 5 x weekly - 2-3 sets - 30 sec hold - Standing Gaze Stabilization with Head Nod  - 1 x daily - 5 x weekly - 2-3 sets - 30 sec hold  GOALS: Goals reviewed with patient? Yes  SHORT TERM GOALS=LTGS: Target date:  06/02/23  Patient to be independent with initial HEP. Baseline: HEP initiated Goal status: MET    ASSESSMENT:  CLINICAL IMPRESSION:  Patient is a 47 y/o F presenting to OPPT with c/o dizziness for the past month but has been improving. Denies head trauma,  infection/illness, vision changes/double vision, hearing loss, tinnitus, otalgia, changes in balance. Reports baseline tinnitus, reports "only 2 migraines in my lifetime." Patient today presented with slight L hypertropia, slightly positive L HIT, and positional testing revealed positive remaining B BPPV with very minimal symptoms. Patient was educated on gentle habituation HEP and reported understanding. Placing patient on 30 day hold d/t minimal remaining symptoms; welcome to return if sx don't resolve with HEP.     OBJECTIVE IMPAIRMENTS: dizziness.   ACTIVITY LIMITATIONS: lifting, bending, bed mobility, and reach over head  PARTICIPATION LIMITATIONS: driving and shopping  PERSONAL FACTORS: Age, Past/current experiences, and 3+ comorbidities: DM, head trauma, anemia, ventricular tach, pituitary tumor  are also affecting patient's functional outcome.   REHAB POTENTIAL: Good  CLINICAL DECISION MAKING: Evolving/moderate complexity  EVALUATION COMPLEXITY: Moderate   PLAN:  PT FREQUENCY: one time visit  PT DURATION: other: -  PLANNED INTERVENTIONS:  Therapeutic exercises, Therapeutic activity, Neuromuscular re-education, Balance training, Gait training, Patient/Family education, Self Care, and Joint mobilization  PLAN FOR NEXT SESSION: 30 day hold at this time   Anette Guarneri, PT, DPT 06/02/23 11:45 AM  [facility] at [facility] [address], [address] [place], [place] [address] Phone # [phone] Fax # [phone]

## 2023-06-02 ENCOUNTER — Other Ambulatory Visit: Payer: Self-pay

## 2023-06-02 ENCOUNTER — Ambulatory Visit: Payer: BC Managed Care – PPO | Attending: Family Medicine | Admitting: Physical Therapy

## 2023-06-02 ENCOUNTER — Encounter: Payer: Self-pay | Admitting: Physical Therapy

## 2023-06-02 DIAGNOSIS — R42 Dizziness and giddiness: Secondary | ICD-10-CM | POA: Diagnosis not present

## 2023-06-03 DIAGNOSIS — R4702 Dysphasia: Secondary | ICD-10-CM | POA: Diagnosis not present

## 2023-06-03 DIAGNOSIS — L272 Dermatitis due to ingested food: Secondary | ICD-10-CM | POA: Diagnosis not present

## 2023-06-20 ENCOUNTER — Encounter: Payer: Self-pay | Admitting: Cardiology

## 2023-06-20 ENCOUNTER — Ambulatory Visit: Payer: BC Managed Care – PPO | Admitting: Cardiology

## 2023-06-20 VITALS — BP 106/68 | HR 78 | Ht 64.0 in | Wt 177.1 lb

## 2023-06-20 DIAGNOSIS — I4729 Other ventricular tachycardia: Secondary | ICD-10-CM | POA: Diagnosis not present

## 2023-06-20 DIAGNOSIS — E66811 Obesity, class 1: Secondary | ICD-10-CM

## 2023-06-20 DIAGNOSIS — E669 Obesity, unspecified: Secondary | ICD-10-CM

## 2023-06-20 DIAGNOSIS — E088 Diabetes mellitus due to underlying condition with unspecified complications: Secondary | ICD-10-CM | POA: Diagnosis not present

## 2023-06-20 NOTE — Patient Instructions (Signed)

## 2023-06-20 NOTE — Progress Notes (Signed)
Cardiology Office Note:    Date:  06/20/2023   ID:  Cynthia [name], DOB 1976/07/06, MRN [id number]  PCP:  Shirlean Mylar, MD  Cardiologist:  Garwin Brothers, MD   Referring MD: Shirlean Mylar, MD    ASSESSMENT:    1. Nonsustained ventricular tachycardia (HCC)   2. Diabetes mellitus due to underlying condition with unspecified complications (HCC)   3. Obesity (BMI 30.0-34.9)    PLAN:    In order of problems listed above:  Primary prevention stressed with the patient.  Importance of compliance with diet medication stressed and patient verbalized standing.  She was advised to walk at least half another day 5 days a week and she promises to do so. Nonsustained ventricular tachycardia: Stable and asymptomatic.  No recurrences.  No palpitations.  Medical management.  She is on beta-blocker. Mixed dyslipidemia: On statin therapy managed by primary care.  Last blood work was reviewed from a year ago she is going to go back for blood work in the next few weeks and will send me a copy.  Goal LDL must be less than 60 in view of diabetes mellitus. Diabetes mellitus: Managed by primary care.  Mildly elevated hemoglobin A1c.  Diet stressed.  Lifestyle modification urged. Patient will be seen in follow-up appointment in 12 months or earlier if the patient has any concerns.   Medication Adjustments/Labs and Tests Ordered: Current medicines are reviewed at length with the patient today.  Concerns regarding medicines are outlined above.  Orders Placed This Encounter  Procedures   EKG 12-Lead   No orders of the defined types were placed in this encounter.    No chief complaint on file.    History of Present Illness:    Cynthia [name] is a 47 y.o. female.  Patient has past medical history of diabetes mellitus, obesity and nonsustained ventricular tachycardia.  She is an active lady and works at [employer].  She tells me that she has been lax with her exercise.  She denies any chest pain orthopnea or PND.  At  the time of my evaluation, the patient is alert awake oriented and in no distress.  Past Medical History:  Diagnosis Date   Allergic rhinitis due to pollen 03/06/2021   Chronic fatigue syndrome 03/06/2021   Chronic pain 03/06/2021   Contact with and (suspected) exposure to other viral communicable diseases 03/06/2021   Diabetes mellitus due to underlying condition with unspecified complications (HCC) 02/17/2020   Diabetes mellitus without complication [facility])    Family history of breast cancer 04/04/2021   Genetic testing 04/17/2021   Negative genetic testing on the CancerNext-Expanded+RNAinsight panel.  The CancerNext-Expanded gene panel offered by [facility] and includes sequencing and rearrangement analysis for the following 77 genes: AIP, ALK, APC*, ATM*, AXIN2, BAP1, BARD1, BLM, BMPR1A, BRCA1*, BRCA2*, BRIP1*, CDC73, CDH1*, CDK4, CDKN1B, CDKN2A, CHEK2*, CTNNA1, DICER1, FANCC, FH, FLCN, GALNT12, KIF1B, LZTR1, MAX, MEN1,   Head trauma    History of gestational diabetes mellitus 03/06/2021   Hyperprolactinemia (HCC)    Iron deficiency 03/06/2021   Iron deficiency anemia 04/13/2021   Nonsustained ventricular tachycardia (HCC) 09/21/2018   Obesity 03/06/2021   Obesity (BMI 30.0-34.9) 04/13/2021   Palpitations 03/03/2018   Pituitary tumor    is now resolved   Plantar fasciitis of left foot 05/25/2021   Prediabetes    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   cyanocobalamin 1000 MCG tablet Take 1,000 mcg by mouth daily.   Ferrous Sulfate (IRON) 325 (65 Fe) MG TABS Take 1 tablet by mouth daily.   fexofenadine (ALLEGRA) 180 MG tablet Take 180 mg by mouth daily.   levonorgestrel-ethinyl estradiol (ALESSE) 0.1-20 MG-MCG tablet Take 1 tablet by mouth daily at 12 noon.   meclizine (ANTIVERT) 25 MG tablet  Take 25 mg by mouth 2 (two) times daily as needed for dizziness.   metFORMIN (GLUCOPHAGE-XR) 500 MG 24 hr tablet Take 2,000 mg by mouth daily.   metoprolol succinate (TOPROL-XL) 50 MG 24 hr tablet Take 1 tablet (50 mg total) by mouth daily. Take with or immediately following a meal.   omeprazole (PRILOSEC) 40 MG capsule Take 40 mg by mouth every morning.   [DISCONTINUED] omeprazole-sodium bicarbonate (ZEGERID) 40-1100 MG capsule Take 1 capsule by mouth daily before breakfast.     Allergies:   Azelastine, Montelukast, Amoxicillin-pot clavulanate, Montelukast sodium, Latex, and Penicillins   Social History   Socioeconomic History   Marital status: Married    Spouse name: Not on file   Number of children: Not on file   Years of education: Not on file   Highest education level: Not on file  Occupational History   Not on file  Tobacco Use   Smoking status: Former   Smokeless tobacco: Never  Vaping Use   Vaping status: Never Used  Substance and Sexual Activity   Alcohol use: Not Currently    Comment: "very rarely"   Drug use: Never   Sexual activity: Not on file  Other Topics Concern   Not on file  Social History Narrative   Not on file   Social Determinants of Health   Financial Resource Strain: Not on file  Food Insecurity: Not on file  Transportation Needs: Not on file  Physical Activity: Not on file  Stress: Not on file  Social Connections: Not on file     Family History: The patient's family history includes Breast cancer (age of onset: 60) in her sister; Cervical cancer (age of onset: 38) in her sister; Dementia in her maternal grandmother; Diabetes in her paternal grandfather; Hypertension in her mother; Lung cancer in her paternal grandfather.  ROS:   Please see the history of present illness.    All other systems reviewed and are negative.  EKGs/Labs/Other Studies Reviewed:    The following studies were reviewed today: .Marland KitchenEKG Interpretation Date/Time:  Friday  June 20 2023 11:09:10 EDT Ventricular Rate:  78 PR Interval:  136 QRS Duration:  98 QT Interval:  384 QTC Calculation: 437 R Axis:   -5  Text Interpretation: Normal sinus rhythm Cannot rule out Anterior infarct , age undetermined When compared with ECG of 03-Feb-2018 18:40, PREVIOUS ECG IS PRESENT Confirmed by Belva Crome [phone]) on 06/20/2023 11:23:24 AM     Recent Labs: 08/15/2022: ALT 19; BUN 12; Creatinine 0.64; Hemoglobin 12.6; Platelet Count 235; Potassium 3.9; Sodium 138  Recent Lipid Panel    Component Value Date/Time   CHOL 142 05/29/2020 1001   TRIG 89 05/29/2020 1001   HDL 47 05/29/2020 1001   CHOLHDL 3.0 05/29/2020 1001   LDLCALC 78 05/29/2020 1001    Physical Exam:    VS:  BP 106/68   Pulse 78   Ht 5\' 4"  (1.626 m)   Wt 177 lb 1.3 oz (80.3 kg)   SpO2 98%  BMI 30.40 kg/m     Wt Readings from Last 3 Encounters:  06/20/23 177 lb 1.3 oz (80.3 kg)  08/15/22 171 lb 1.6 oz (77.6 kg)  06/01/22 170 lb (77.1 kg)     GEN: Patient is in no acute distress HEENT: Normal NECK: No JVD; No carotid bruits LYMPHATICS: No lymphadenopathy CARDIAC: Hear sounds regular, 2/6 systolic murmur at the apex. RESPIRATORY:  Clear to auscultation without rales, wheezing or rhonchi  ABDOMEN: Soft, non-tender, non-distended MUSCULOSKELETAL:  No edema; No deformity  SKIN: Warm and dry NEUROLOGIC:  Alert and oriented x 3 PSYCHIATRIC:  Normal affect   Signed, Garwin Brothers, MD  06/20/2023 11:24 AM    [facility]

## 2023-06-21 ENCOUNTER — Other Ambulatory Visit: Payer: Self-pay | Admitting: Cardiology

## 2023-07-18 DIAGNOSIS — M545 Low back pain, unspecified: Secondary | ICD-10-CM | POA: Diagnosis not present

## 2023-07-18 DIAGNOSIS — N390 Urinary tract infection, site not specified: Secondary | ICD-10-CM | POA: Diagnosis not present

## 2023-07-18 DIAGNOSIS — R110 Nausea: Secondary | ICD-10-CM | POA: Diagnosis not present

## 2023-07-29 DIAGNOSIS — R7303 Prediabetes: Secondary | ICD-10-CM | POA: Diagnosis not present

## 2023-07-29 DIAGNOSIS — Z0000 Encounter for general adult medical examination without abnormal findings: Secondary | ICD-10-CM | POA: Diagnosis not present

## 2023-07-29 DIAGNOSIS — E559 Vitamin D deficiency, unspecified: Secondary | ICD-10-CM | POA: Diagnosis not present

## 2023-07-29 DIAGNOSIS — E785 Hyperlipidemia, unspecified: Secondary | ICD-10-CM | POA: Diagnosis not present

## 2023-07-29 DIAGNOSIS — E538 Deficiency of other specified B group vitamins: Secondary | ICD-10-CM | POA: Diagnosis not present

## 2023-07-29 DIAGNOSIS — E119 Type 2 diabetes mellitus without complications: Secondary | ICD-10-CM | POA: Diagnosis not present

## 2023-07-29 DIAGNOSIS — D509 Iron deficiency anemia, unspecified: Secondary | ICD-10-CM | POA: Diagnosis not present

## 2023-08-12 DIAGNOSIS — J3081 Allergic rhinitis due to animal (cat) (dog) hair and dander: Secondary | ICD-10-CM | POA: Diagnosis not present

## 2023-08-12 DIAGNOSIS — J3089 Other allergic rhinitis: Secondary | ICD-10-CM | POA: Diagnosis not present

## 2023-08-12 DIAGNOSIS — L2089 Other atopic dermatitis: Secondary | ICD-10-CM | POA: Diagnosis not present

## 2023-08-12 DIAGNOSIS — T781XXA Other adverse food reactions, not elsewhere classified, initial encounter: Secondary | ICD-10-CM | POA: Diagnosis not present

## 2023-08-14 DIAGNOSIS — T781XXA Other adverse food reactions, not elsewhere classified, initial encounter: Secondary | ICD-10-CM | POA: Diagnosis not present

## 2023-08-15 ENCOUNTER — Other Ambulatory Visit: Payer: Self-pay | Admitting: Hematology and Oncology

## 2023-08-15 DIAGNOSIS — D500 Iron deficiency anemia secondary to blood loss (chronic): Secondary | ICD-10-CM

## 2023-08-18 ENCOUNTER — Inpatient Hospital Stay: Payer: BC Managed Care – PPO | Attending: Hematology and Oncology

## 2023-08-18 ENCOUNTER — Inpatient Hospital Stay (HOSPITAL_BASED_OUTPATIENT_CLINIC_OR_DEPARTMENT_OTHER): Payer: BC Managed Care – PPO | Admitting: Hematology and Oncology

## 2023-08-18 VITALS — BP 117/65 | HR 69 | Temp 97.9°F | Resp 17 | Ht 64.0 in | Wt 174.2 lb

## 2023-08-18 DIAGNOSIS — D500 Iron deficiency anemia secondary to blood loss (chronic): Secondary | ICD-10-CM | POA: Diagnosis not present

## 2023-08-18 DIAGNOSIS — D509 Iron deficiency anemia, unspecified: Secondary | ICD-10-CM | POA: Diagnosis not present

## 2023-08-18 LAB — FERRITIN: Ferritin: 46 ng/mL (ref 11–307)

## 2023-08-18 LAB — CBC WITH DIFFERENTIAL (CANCER CENTER ONLY)
Abs Immature Granulocytes: 0.04 10*3/uL (ref 0.00–0.07)
Basophils Absolute: 0 10*3/uL (ref 0.0–0.1)
Basophils Relative: 1 %
Eosinophils Absolute: 0.1 10*3/uL (ref 0.0–0.5)
Eosinophils Relative: 2 %
HCT: 35 % — ABNORMAL LOW (ref 36.0–46.0)
Hemoglobin: 11.8 g/dL — ABNORMAL LOW (ref 12.0–15.0)
Immature Granulocytes: 1 %
Lymphocytes Relative: 34 %
Lymphs Abs: 2.7 10*3/uL (ref 0.7–4.0)
MCH: 30.8 pg (ref 26.0–34.0)
MCHC: 33.7 g/dL (ref 30.0–36.0)
MCV: 91.4 fL (ref 80.0–100.0)
Monocytes Absolute: 0.5 10*3/uL (ref 0.1–1.0)
Monocytes Relative: 6 %
Neutro Abs: 4.6 10*3/uL (ref 1.7–7.7)
Neutrophils Relative %: 56 %
Platelet Count: 230 10*3/uL (ref 150–400)
RBC: 3.83 MIL/uL — ABNORMAL LOW (ref 3.87–5.11)
RDW: 13 % (ref 11.5–15.5)
WBC Count: 7.9 10*3/uL (ref 4.0–10.5)
nRBC: 0 % (ref 0.0–0.2)

## 2023-08-18 LAB — CMP (CANCER CENTER ONLY)
ALT: 10 U/L (ref 0–44)
AST: 11 U/L — ABNORMAL LOW (ref 15–41)
Albumin: 3.8 g/dL (ref 3.5–5.0)
Alkaline Phosphatase: 58 U/L (ref 38–126)
Anion gap: 7 (ref 5–15)
BUN: 12 mg/dL (ref 6–20)
CO2: 26 mmol/L (ref 22–32)
Calcium: 8.8 mg/dL — ABNORMAL LOW (ref 8.9–10.3)
Chloride: 107 mmol/L (ref 98–111)
Creatinine: 0.69 mg/dL (ref 0.44–1.00)
GFR, Estimated: >60 mL/min (ref >60)
Glucose, Bld: 96 mg/dL (ref 70–99)
Potassium: 3.7 mmol/L (ref 3.5–5.1)
Sodium: 140 mmol/L (ref 135–145)
Total Bilirubin: 0.7 mg/dL (ref 0.3–1.2)
Total Protein: 6.5 g/dL (ref 6.5–8.1)

## 2023-08-18 LAB — IRON AND IRON BINDING CAPACITY (CC-WL,HP ONLY)
Iron: 81 ug/dL (ref 28–170)
Saturation Ratios: 19 % (ref 10.4–31.8)
TIBC: 423 ug/dL (ref 250–450)
UIBC: 342 ug/dL (ref 148–442)

## 2023-08-18 NOTE — Progress Notes (Signed)
[facility] CONSULT NOTE  Patient Care Team: Shirlean Mylar, [name]  CHIEF COMPLAINTS/PURPOSE OF CONSULTATION:  IDA, possible need for IV iron infusion  ASSESSMENT & PLAN:   Iron deficiency anemia This is a very pleasant 47 year old female patient with no significant past medical history except for prolactinoma and prediabetes, iron deficiency referred to hematology for evaluation and recommendations regarding her iron deficiency anemia.  She has been doing really well.  No concerns or questions today.  Physical examination unremarkable.  CBC reviewed, hemoglobin at 11.8 g/dL.  Iron panel and ferritin pending.  At this time there appears to be no clinical concerns for worsening anemia.  We will review her ferritin when available.  She should continue oral iron supplementation and return to clinic in 6 months or sooner as needed   HISTORY OF PRESENTING ILLNESS:   Cynthia [name] 47 y.o. female is here because of IDA  HPI  This is a very pleasant 47 year old female patient with past medical history significant for prediabetes, prolactinoma, chronic fatigue referred to hematology for evaluation and recommendations for iron deficiency. We initially tried oral iron for about 8 weeks but she continued to complain of worsening fatigue hence we have given her a trial of intravenous iron and she is here for follow-up.  Interval history  Cynthia [name] is here for follow-up.  Since her last visit here, she has been doing well.  No unusual fatigue, pica, shortness of breath, dizziness or palpitations. She has been taking the oral iron supplementation once a day.  She denies any menstrual bleeding.  No blood loss in stool or black stool or hematuria.  Rest of the pertinent 10 point ROS reviewed and negative  MEDICAL HISTORY:  Past Medical History:  Diagnosis Date   Allergic rhinitis due to pollen 03/06/2021   Chronic fatigue syndrome 03/06/2021   Chronic pain  03/06/2021   Contact with and (suspected) exposure to other viral communicable diseases 03/06/2021   Diabetes mellitus due to underlying condition with unspecified complications (HCC) 02/17/2020   Diabetes mellitus without complication (HCC)    Family history of breast cancer 04/04/2021   Genetic testing 04/17/2021   Negative genetic testing on the CancerNext-Expanded+RNAinsight panel.  The CancerNext-Expanded gene panel offered by [facility] and includes sequencing and rearrangement analysis for the following 77 genes: AIP, ALK, APC*, ATM*, AXIN2, BAP1, BARD1, BLM, BMPR1A, BRCA1*, BRCA2*, BRIP1*, CDC73, CDH1*, CDK4, CDKN1B, CDKN2A, CHEK2*, CTNNA1, DICER1, FANCC, FH, FLCN, GALNT12, KIF1B, LZTR1, MAX, MEN1,   Head trauma    History of gestational diabetes mellitus 03/06/2021   Hyperprolactinemia (HCC)    Iron deficiency 03/06/2021   Iron deficiency anemia 04/13/2021   Nonsustained ventricular tachycardia (HCC) 09/21/2018   Obesity 03/06/2021   Obesity (BMI 30.0-34.9) 04/13/2021   Palpitations 03/03/2018   Pituitary tumor    is now resolved   Plantar fasciitis of left foot 05/25/2021   Prediabetes    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    SURGICAL HISTORY: Past Surgical History:  Procedure Laterality Date   CESAREAN SECTION      SOCIAL HISTORY: Social History   Socioeconomic History   Marital status: Married    Spouse name: Not on file   Number of children: Not on file   Years of education: Not on file   Highest education level: Not on file  Occupational History   Not on file  Tobacco Use   Smoking status: Former   Smokeless tobacco: Never  Vaping Use  Vaping status: Never Used  Substance and Sexual Activity   Alcohol use: Not Currently    Comment: "very rarely"   Drug use: Never   Sexual activity: Not on file  Other Topics Concern   Not on file  Social History Narrative   Not on file   Social Determinants of Health   Financial Resource Strain: Not on file  Food Insecurity: Not  on file  Transportation Needs: Not on file  Physical Activity: Not on file  Stress: Not on file  Social Connections: Not on file  Intimate Partner Violence: Not on file    FAMILY HISTORY: Family History  Problem Relation Age of Onset   Hypertension Mother    Cervical cancer Sister 91   Breast cancer Sister 43   Diabetes Paternal Grandfather    Lung cancer Paternal Grandfather    Dementia Maternal Grandmother     ALLERGIES:  is allergic to azelastine, montelukast, amoxicillin-pot clavulanate, montelukast sodium, latex, and penicillins.  MEDICATIONS:  Current Outpatient Medications  Medication Sig Dispense Refill   atorvastatin (LIPITOR) 10 MG tablet Take 1 tablet (10 mg total) by mouth daily. 90 tablet 3   cyanocobalamin 1000 MCG tablet Take 1,000 mcg by mouth daily.     Ferrous Sulfate (IRON) 325 (65 Fe) MG TABS Take 1 tablet by mouth daily.     fexofenadine (ALLEGRA) 180 MG tablet Take 180 mg by mouth daily.     levonorgestrel-ethinyl estradiol (ALESSE) 0.1-20 MG-MCG tablet Take 1 tablet by mouth daily at 12 noon.     meclizine (ANTIVERT) 25 MG tablet Take 25 mg by mouth 2 (two) times daily as needed for dizziness.     metFORMIN (GLUCOPHAGE-XR) 500 MG 24 hr tablet Take 2,000 mg by mouth daily.     metoprolol succinate (TOPROL-XL) 50 MG 24 hr tablet TAKE 1 TABLET BY MOUTH ONCE DAILY TAKE  WITH  OR  IMMEDIATELY  FOLLOWING  A  MEAL 90 tablet 1   No current facility-administered medications for this visit.    PHYSICAL EXAMINATION:  ECOG PERFORMANCE STATUS: 1 - Symptomatic but completely ambulatory  Vitals:   08/18/23 1127  BP: 117/65  Pulse: 69  Resp: 17  Temp: 97.9 F (36.6 C)  SpO2: 99%    Filed Weights   08/18/23 1127  Weight: 174 lb 3.2 oz (79 kg)   Physical Exam Constitutional:      Appearance: Normal appearance.  Cardiovascular:     Rate and Rhythm: Normal rate and regular rhythm.     Pulses: Normal pulses.     Heart sounds: Normal heart sounds.   Pulmonary:     Effort: Pulmonary effort is normal.     Breath sounds: Normal breath sounds.  Abdominal:     General: Abdomen is flat. Bowel sounds are normal.     Palpations: Abdomen is soft.  Musculoskeletal:        General: Normal range of motion.     Cervical back: Normal range of motion and neck supple. No rigidity.  Lymphadenopathy:     Cervical: No cervical adenopathy.  Skin:    General: Skin is warm and dry.  Neurological:     General: No focal deficit present.     Mental Status: She is alert.  Psychiatric:        Mood and Affect: Mood normal.      LABORATORY DATA:  I have reviewed the data as listed Lab Results  Component Value Date   WBC 7.9 08/18/2023   HGB  11.8 (L) 08/18/2023   HCT 35.0 (L) 08/18/2023   MCV 91.4 08/18/2023   PLT 230 08/18/2023     Chemistry      Component Value Date/Time   NA 140 08/18/2023 1109   NA 141 04/11/2020 0932   K 3.7 08/18/2023 1109   CL 107 08/18/2023 1109   CO2 26 08/18/2023 1109   BUN 12 08/18/2023 1109   BUN 14 04/11/2020 0932   CREATININE 0.69 08/18/2023 1109      Component Value Date/Time   CALCIUM 8.8 (L) 08/18/2023 1109   ALKPHOS 58 08/18/2023 1109   AST 11 (L) 08/18/2023 1109   ALT 10 08/18/2023 1109   BILITOT 0.7 08/18/2023 1109     CBC from today reviewed, hemoglobin at 11.8 g/dL, MCV of 40.9, normal white blood cell count and platelet count.  RADIOGRAPHIC STUDIES: I have personally reviewed the radiological images as listed and agreed with the findings in the report. No results found.  All questions were answered. The patient knows to call the clinic with any problems, questions or concerns. I spent 10 minutes in the care of this patient including H and P, review of records, counseling and coordination of care.     Rachel Moulds, MD 08/18/2023 2:13 PM

## 2023-08-18 NOTE — Assessment & Plan Note (Signed)
This is a very pleasant 47 year old female patient with no significant past medical history except for prolactinoma and prediabetes, iron deficiency referred to hematology for evaluation and recommendations regarding her iron deficiency anemia.  She has been doing really well.  No concerns or questions today.  Physical examination unremarkable.  CBC reviewed, hemoglobin at 11.8 g/dL.  Iron panel and ferritin pending.  At this time there appears to be no clinical concerns for worsening anemia.  We will review her ferritin when available.  She should continue oral iron supplementation and return to clinic in 6 months or sooner as needed

## 2023-09-17 DIAGNOSIS — K317 Polyp of stomach and duodenum: Secondary | ICD-10-CM | POA: Diagnosis not present

## 2023-09-17 DIAGNOSIS — K449 Diaphragmatic hernia without obstruction or gangrene: Secondary | ICD-10-CM | POA: Diagnosis not present

## 2023-09-17 DIAGNOSIS — R140 Abdominal distension (gaseous): Secondary | ICD-10-CM | POA: Diagnosis not present

## 2023-09-17 DIAGNOSIS — R110 Nausea: Secondary | ICD-10-CM | POA: Diagnosis not present

## 2023-09-17 DIAGNOSIS — K2930 Chronic superficial gastritis without bleeding: Secondary | ICD-10-CM | POA: Diagnosis not present

## 2023-09-22 ENCOUNTER — Other Ambulatory Visit: Payer: Self-pay | Admitting: Medical Genetics

## 2023-09-22 DIAGNOSIS — Z006 Encounter for examination for normal comparison and control in clinical research program: Secondary | ICD-10-CM

## 2023-09-26 ENCOUNTER — Encounter: Payer: Self-pay | Admitting: Hematology and Oncology

## 2023-09-29 ENCOUNTER — Encounter: Payer: Self-pay | Admitting: Hematology and Oncology

## 2023-10-15 DIAGNOSIS — H40011 Open angle with borderline findings, low risk, right eye: Secondary | ICD-10-CM | POA: Diagnosis not present

## 2023-10-22 ENCOUNTER — Other Ambulatory Visit (HOSPITAL_COMMUNITY): Payer: BC Managed Care – PPO

## 2023-11-19 ENCOUNTER — Other Ambulatory Visit (HOSPITAL_COMMUNITY)
Admission: RE | Admit: 2023-11-19 | Discharge: 2023-11-19 | Disposition: A | Discharge status: Home / Self Care | Payer: Self-pay | Source: Ambulatory Visit | Attending: Medical Genetics | Admitting: Medical Genetics

## 2023-11-19 DIAGNOSIS — Z006 Encounter for examination for normal comparison and control in clinical research program: Secondary | ICD-10-CM | POA: Insufficient documentation

## 2023-12-01 LAB — GENECONNECT MOLECULAR SCREEN: Genetic Analysis Overall Interpretation: NEGATIVE

## 2023-12-14 ENCOUNTER — Other Ambulatory Visit: Payer: Self-pay | Admitting: Cardiology

## 2023-12-23 ENCOUNTER — Encounter: Payer: Self-pay | Admitting: Cardiology

## 2024-01-21 IMAGING — MG MM DIGITAL SCREENING BILAT W/ TOMO AND CAD
8 series · 8 of 24 positions shown · non-contrast
Comparison: Previous exam(s).

CLINICAL DATA: Screening.

EXAM:
DIGITAL SCREENING BILATERAL MAMMOGRAM WITH TOMOSYNTHESIS AND CAD
TECHNIQUE: Bilateral screening digital craniocaudal and mediolateral oblique
mammograms were obtained. Bilateral screening digital breast
tomosynthesis was performed. The images were evaluated with
computer-aided detection.

[R CC synth-2D]
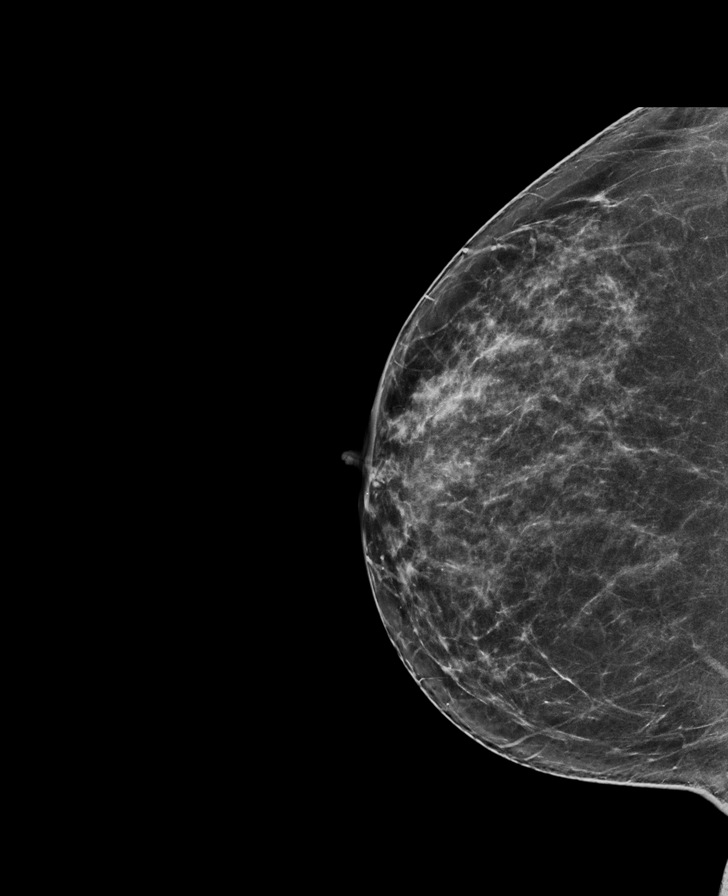

[R MLO synth-2D]
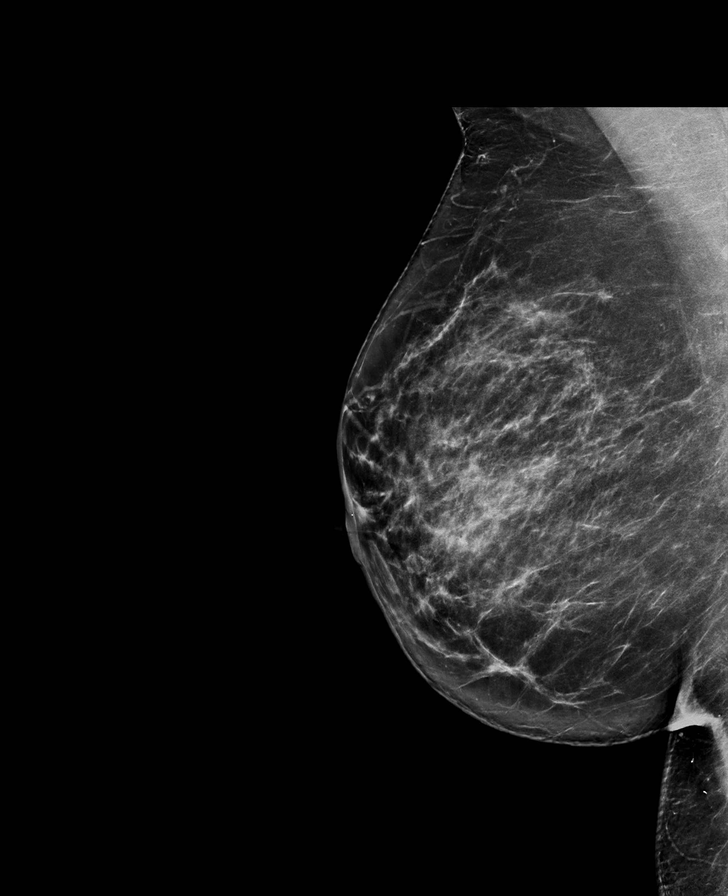

[L MLO synth-2D]
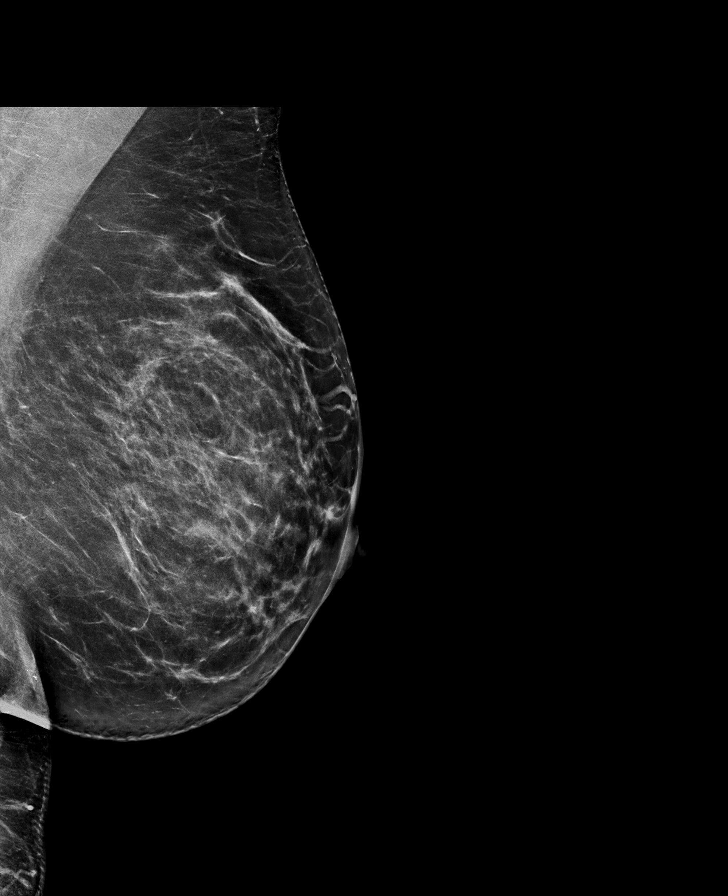

[L CC synth-2D]
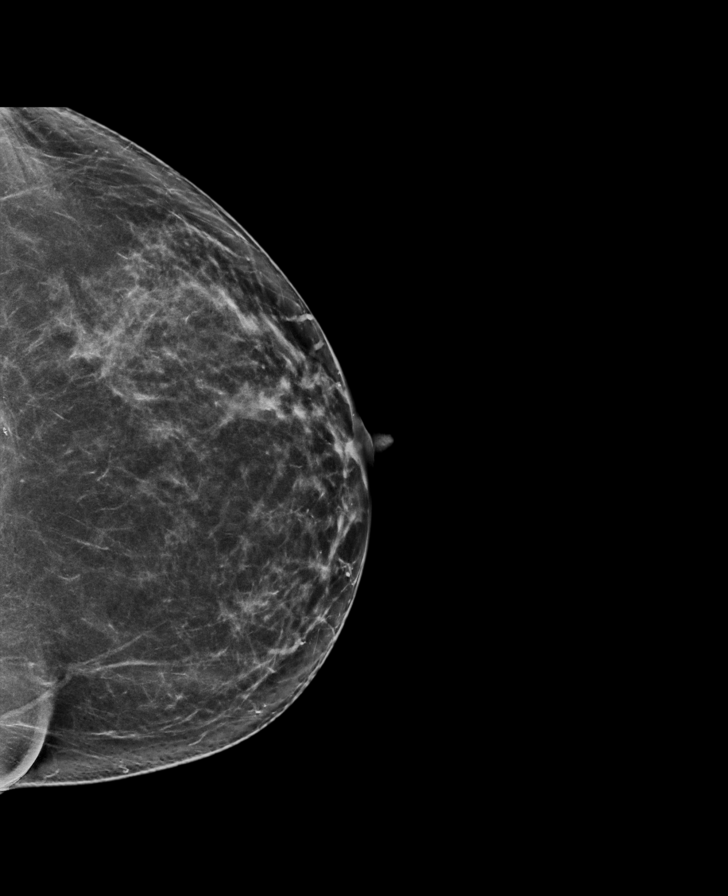

[L CC tomo · tomo slice 37/72.0]
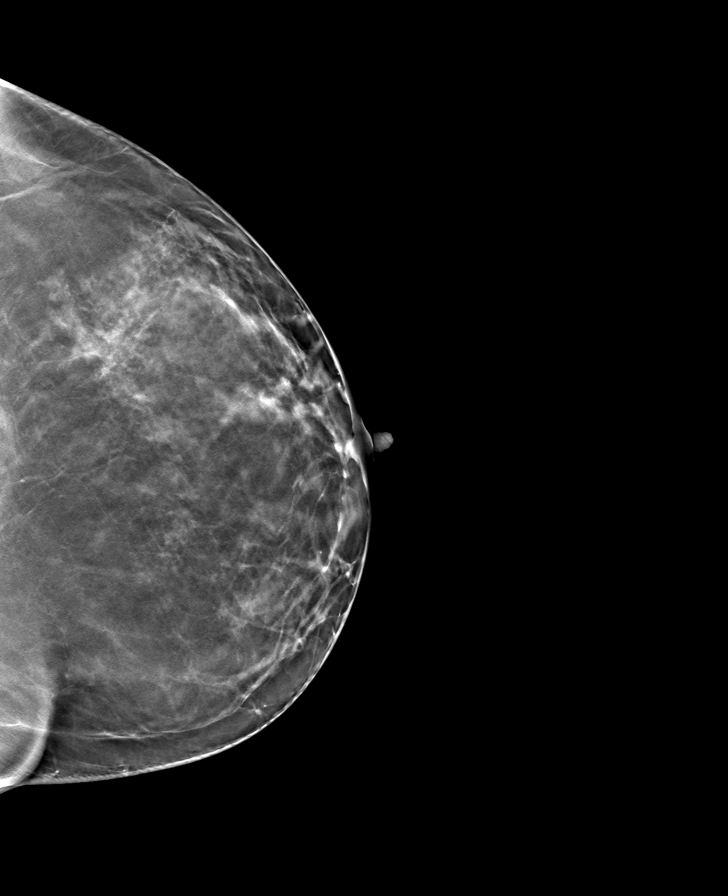

[R MLO tomo · tomo slice 42/83.0]
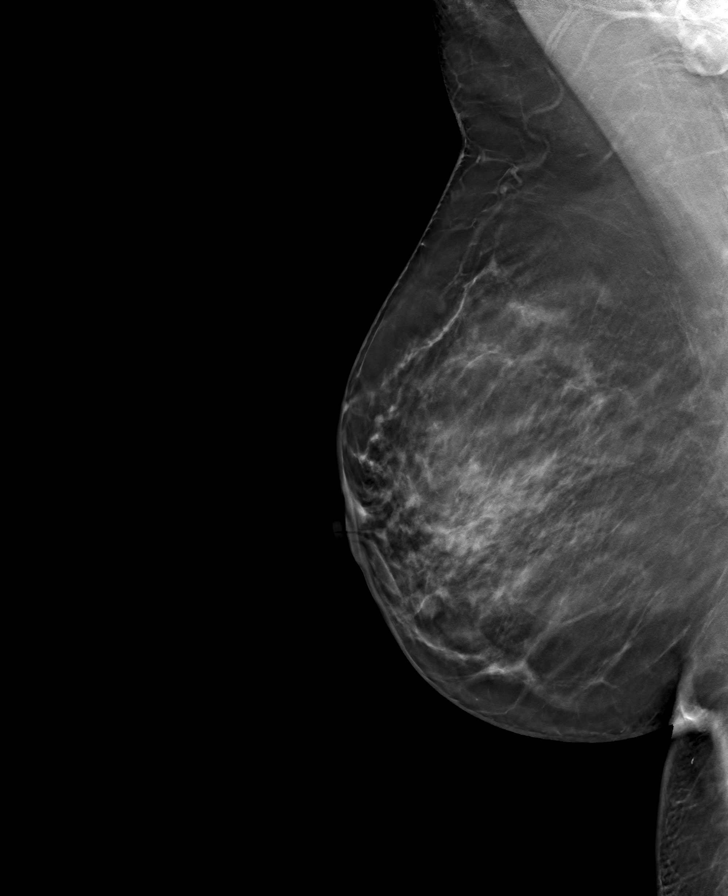

[L MLO tomo · tomo slice 41/82.0]
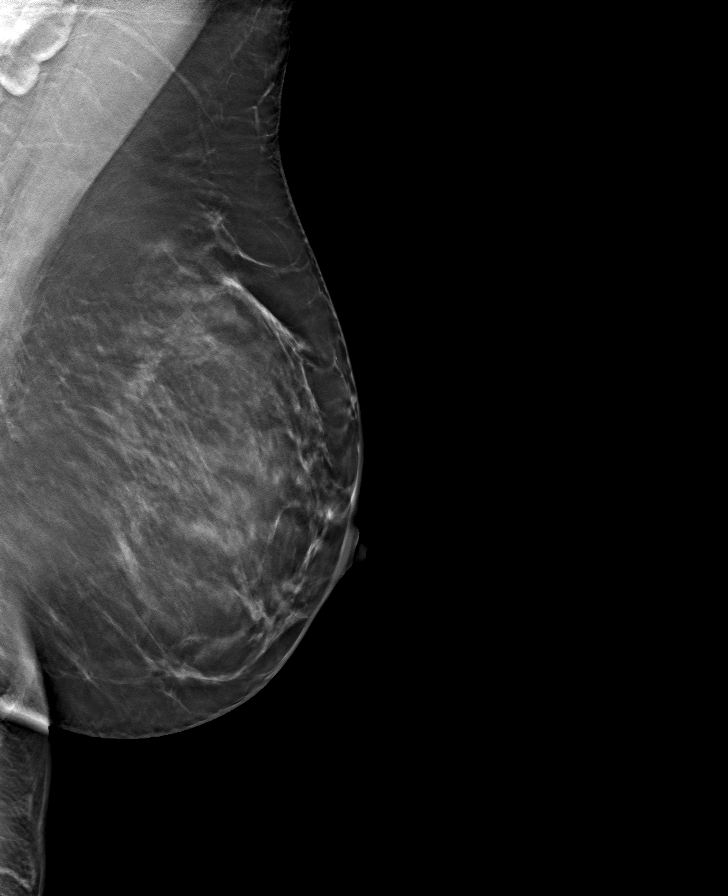

[R CC tomo · tomo slice 35/69.0]
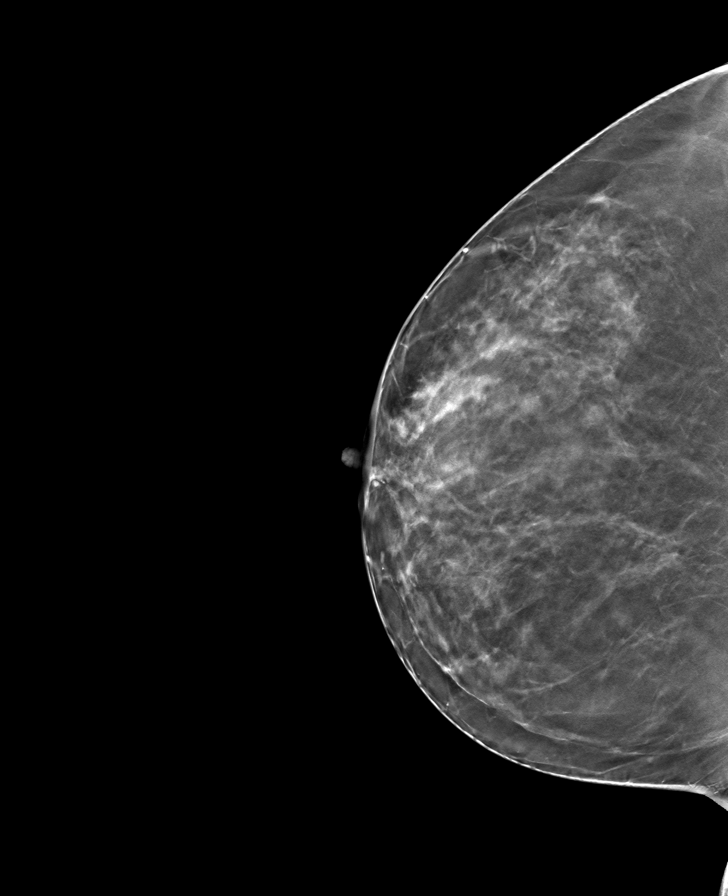

[8 of 24 positions shown; findings below may reference images not displayed]

ACR Breast Density Category c: The breast tissue is heterogeneously
dense, which may obscure small masses.
FINDINGS: There are no findings suspicious for malignancy.
IMPRESSION: No mammographic evidence of malignancy. A result letter of this
screening mammogram will be mailed directly to the patient.

RECOMMENDATION:
Screening mammogram in one year. (Code:Q3-W-BC3)

BI-RADS CATEGORY  1: Negative.

## 2024-02-23 ENCOUNTER — Inpatient Hospital Stay: Payer: BC Managed Care – PPO | Admitting: Hematology and Oncology

## 2024-02-23 ENCOUNTER — Inpatient Hospital Stay: Payer: BC Managed Care – PPO

## 2024-02-23 ENCOUNTER — Other Ambulatory Visit: Payer: Self-pay

## 2024-02-23 DIAGNOSIS — D500 Iron deficiency anemia secondary to blood loss (chronic): Secondary | ICD-10-CM

## 2024-02-24 ENCOUNTER — Inpatient Hospital Stay: Attending: Hematology and Oncology

## 2024-02-24 ENCOUNTER — Other Ambulatory Visit: Payer: Self-pay | Admitting: Internal Medicine

## 2024-02-24 ENCOUNTER — Encounter: Payer: Self-pay | Admitting: Hematology and Oncology

## 2024-02-24 ENCOUNTER — Inpatient Hospital Stay: Admitting: Hematology and Oncology

## 2024-02-24 VITALS — BP 132/73 | HR 80 | Temp 97.6°F | Resp 16 | Wt 170.3 lb

## 2024-02-24 DIAGNOSIS — D500 Iron deficiency anemia secondary to blood loss (chronic): Secondary | ICD-10-CM

## 2024-02-24 DIAGNOSIS — D509 Iron deficiency anemia, unspecified: Secondary | ICD-10-CM | POA: Insufficient documentation

## 2024-02-24 DIAGNOSIS — D352 Benign neoplasm of pituitary gland: Secondary | ICD-10-CM

## 2024-02-24 DIAGNOSIS — E221 Hyperprolactinemia: Secondary | ICD-10-CM

## 2024-02-24 NOTE — Assessment & Plan Note (Signed)
  Assessment & Plan Iron deficiency Asymptomatic with normal hemoglobin and low normal ferritin. Menstruation may contribute to iron loss, balanced by current supplementation. - Continue iron supplementation once daily. - Monitor ferritin and hemoglobin levels. - Consider future supplementation if menstruation changes significantly.  Elevated Vitamin B12 levels Vitamin B12 levels exceed 1500 due to supplementation. Advised to adjust supplementation. - Discontinue Vitamin B12 supplementation for one month. - Restart supplementation at a lower frequency, such as twice weekly.  Follow-up Follow-up in six months unless symptoms worsen. Up to date with colonoscopy. No family history of colon cancer, history of non-cancerous polyps in father and grandfather. - Schedule follow-up in six months. - Perform CBC and ferritin tests at primary care provider if more cost-effective.

## 2024-02-24 NOTE — Progress Notes (Signed)
 [facility] CONSULT NOTE  Patient Care Team: Camie Patience, FNP as PCP - General (Family Medicine)  CHIEF COMPLAINTS/PURPOSE OF CONSULTATION:  IDA, possible need for IV iron infusion  ASSESSMENT & PLAN:   Iron deficiency anemia  Assessment & Plan Iron deficiency Asymptomatic with normal hemoglobin and low normal ferritin. Menstruation may contribute to iron loss, balanced by current supplementation. - Continue iron supplementation once daily. - Monitor ferritin and hemoglobin levels. - Consider future supplementation if menstruation changes significantly.  Elevated Vitamin B12 levels Vitamin B12 levels exceed 1500 due to supplementation. Advised to adjust supplementation. - Discontinue Vitamin B12 supplementation for one month. - Restart supplementation at a lower frequency, such as twice weekly.  Follow-up Follow-up in six months unless symptoms worsen. Up to date with colonoscopy. No family history of colon cancer, history of non-cancerous polyps in father and grandfather. - Schedule follow-up in six months. - Perform CBC and ferritin tests at primary care provider if more cost-effective.      HISTORY OF PRESENTING ILLNESS:   Cynthia [name] 48 y.o. female is here because of IDA  HPI  This is a very pleasant 48 year old female patient with past medical history significant for prediabetes, prolactinoma, chronic fatigue referred to hematology for evaluation and recommendations for iron deficiency.  Discussed the use of AI scribe software for clinical note transcription with the patient, who gave verbal consent to proceed.  History of Present Illness Cynthia [name] is a 48 year old female with iron deficiency anemia who presents for follow-up.  She has no current symptoms of iron deficiency such as pica, significant fatigue, or shortness of breath. She experiences mild fatigue but describes it as 'nothing like extreme.' Her menstruation has started to get  shorter, and she continues to take an iron pill once a day. Her hemoglobin level is 13.2, which is within the normal range for her age. The last ferritin level was 37.1, which is considered low normal. She continues to take her iron supplement to manage her iron levels, especially in light of menstruation-related iron loss.  She mentions that her B12 levels were high, with a recent test showing a level over 1500. She takes a B12 supplement at a dose of 1000 mcg. There is no family history of colon cancer, although her father and grandfather had non-cancerous polyps. No symptoms such as brittle nails or hair loss.   MEDICAL HISTORY:  Past Medical History:  Diagnosis Date   Allergic rhinitis due to pollen 03/06/2021   Chronic fatigue syndrome 03/06/2021   Chronic pain 03/06/2021   Contact with and (suspected) exposure to other viral communicable diseases 03/06/2021   Diabetes mellitus due to underlying condition with unspecified complications (HCC) 02/17/2020   Diabetes mellitus without complication (HCC)    Family history of breast cancer 04/04/2021   Genetic testing 04/17/2021   Negative genetic testing on the CancerNext-Expanded+RNAinsight panel.  The CancerNext-Expanded gene panel offered by [employer] and includes sequencing and rearrangement analysis for the following 77 genes: AIP, ALK, APC*, ATM*, AXIN2, BAP1, BARD1, BLM, BMPR1A, BRCA1*, BRCA2*, BRIP1*, CDC73, CDH1*, CDK4, CDKN1B, CDKN2A, CHEK2*, CTNNA1, DICER1, FANCC, FH, FLCN, GALNT12, KIF1B, LZTR1, MAX, MEN1,   Head trauma    History of gestational diabetes mellitus 03/06/2021   Hyperprolactinemia (HCC)    Iron deficiency 03/06/2021   Iron deficiency anemia 04/13/2021   Nonsustained ventricular tachycardia (HCC) 09/21/2018   Obesity 03/06/2021   Obesity (BMI 30.0-34.9) 04/13/2021   Palpitations 03/03/2018   Pituitary tumor  is now resolved   Plantar fasciitis of left foot 05/25/2021   Prediabetes    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     SURGICAL HISTORY: Past Surgical History:  Procedure Laterality Date   CESAREAN SECTION      SOCIAL HISTORY: Social History   Socioeconomic History   Marital status: Married    Spouse name: Not on file   Number of children: Not on file   Years of education: Not on file   Highest education level: Not on file  Occupational History   Not on file  Tobacco Use   Smoking status: Former   Smokeless tobacco: Never  Vaping Use   Vaping status: Never Used  Substance and Sexual Activity   Alcohol use: Not Currently    Comment: "very rarely"   Drug use: Never   Sexual activity: Not on file  Other Topics Concern   Not on file  Social History Narrative   Not on file   Social Drivers of Health   Financial Resource Strain: Not on file  Food Insecurity: Not on file  Transportation Needs: Not on file  Physical Activity: Not on file  Stress: Not on file  Social Connections: Not on file  Intimate Partner Violence: Not on file    FAMILY HISTORY: Family History  Problem Relation Age of Onset   Hypertension Mother    Cervical cancer Sister 31   Breast cancer Sister 33   Diabetes Paternal Grandfather    Lung cancer Paternal Grandfather    Dementia Maternal Grandmother     ALLERGIES:  is allergic to azelastine, montelukast, amoxicillin-pot clavulanate, montelukast sodium, latex, and penicillins.  MEDICATIONS:  Current Outpatient Medications  Medication Sig Dispense Refill   atorvastatin (LIPITOR) 10 MG tablet Take 1 tablet (10 mg total) by mouth daily. 90 tablet 3   cyanocobalamin 1000 MCG tablet Take 1,000 mcg by mouth daily.     Ferrous Sulfate (IRON) 325 (65 Fe) MG TABS Take 1 tablet by mouth daily.     fexofenadine (ALLEGRA) 180 MG tablet Take 180 mg by mouth daily.     levonorgestrel-ethinyl estradiol (ALESSE) 0.1-20 MG-MCG tablet Take 1 tablet by mouth daily at 12 noon.     meclizine (ANTIVERT) 25 MG tablet Take 25 mg by mouth 2 (two) times daily as needed for  dizziness.     metFORMIN (GLUCOPHAGE-XR) 500 MG 24 hr tablet Take 2,000 mg by mouth daily.     metoprolol succinate (TOPROL-XL) 50 MG 24 hr tablet Take 1 tablet (50 mg total) by mouth daily. 90 tablet 1   No current facility-administered medications for this visit.    PHYSICAL EXAMINATION:  ECOG PERFORMANCE STATUS: 1 - Symptomatic but completely ambulatory  Vitals:   02/24/24 1032  BP: 132/73  Pulse: 80  Resp: 16  Temp: 97.6 F (36.4 C)  SpO2: 98%    Filed Weights   02/24/24 1032  Weight: 170 lb 4.8 oz (77.2 kg)   Physical Exam Constitutional:      Appearance: Normal appearance.  Cardiovascular:     Rate and Rhythm: Normal rate and regular rhythm.     Pulses: Normal pulses.     Heart sounds: Normal heart sounds.  Pulmonary:     Effort: Pulmonary effort is normal.     Breath sounds: Normal breath sounds.  Abdominal:     General: Abdomen is flat. Bowel sounds are normal.     Palpations: Abdomen is soft.  Musculoskeletal:        General: Normal range  of motion.     Cervical back: Normal range of motion and neck supple. No rigidity.  Lymphadenopathy:     Cervical: No cervical adenopathy.  Skin:    General: Skin is warm and dry.  Neurological:     General: No focal deficit present.     Mental Status: She is alert.  Psychiatric:        Mood and Affect: Mood normal.      LABORATORY DATA:  I have reviewed the data as listed Lab Results  Component Value Date   WBC 7.9 08/18/2023   HGB 11.8 (L) 08/18/2023   HCT 35.0 (L) 08/18/2023   MCV 91.4 08/18/2023   PLT 230 08/18/2023     Chemistry      Component Value Date/Time   NA 140 08/18/2023 1109   NA 141 04/11/2020 0932   K 3.7 08/18/2023 1109   CL 107 08/18/2023 1109   CO2 26 08/18/2023 1109   BUN 12 08/18/2023 1109   BUN 14 04/11/2020 0932   CREATININE 0.69 08/18/2023 1109      Component Value Date/Time   CALCIUM 8.8 (L) 08/18/2023 1109   ALKPHOS 58 08/18/2023 1109   AST 11 (L) 08/18/2023 1109   ALT  10 08/18/2023 1109   BILITOT 0.7 08/18/2023 1109     CBC from today reviewed, hemoglobin at 11.8 g/dL, MCV of 16.1, normal white blood cell count and platelet count.  RADIOGRAPHIC STUDIES: I have personally reviewed the radiological images as listed and agreed with the findings in the report. No results found.  All questions were answered. The patient knows to call the clinic with any problems, questions or concerns. I spent 20 minutes in the care of this patient including H and P, review of records, counseling and coordination of care.     Murleen Arms, MD 02/24/2024 11:24 AM

## 2024-02-27 ENCOUNTER — Encounter: Payer: Self-pay | Admitting: Cardiology

## 2024-03-02 ENCOUNTER — Encounter: Payer: Self-pay | Admitting: Cardiology

## 2024-03-16 ENCOUNTER — Ambulatory Visit
Admission: RE | Admit: 2024-03-16 | Discharge: 2024-03-16 | Disposition: A | Discharge status: Home / Self Care | Source: Ambulatory Visit | Attending: Internal Medicine | Admitting: Internal Medicine

## 2024-03-16 DIAGNOSIS — E221 Hyperprolactinemia: Secondary | ICD-10-CM

## 2024-03-16 DIAGNOSIS — D352 Benign neoplasm of pituitary gland: Secondary | ICD-10-CM

## 2024-03-16 MED ORDER — GADOPICLENOL 0.5 MMOL/ML IV SOLN
8.0000 mL | Freq: Once | INTRAVENOUS | Status: AC | PRN
  Administered 2024-03-16: 8 mL via INTRAVENOUS

## 2024-06-10 ENCOUNTER — Other Ambulatory Visit: Payer: Self-pay | Admitting: Cardiology

## 2024-07-13 ENCOUNTER — Other Ambulatory Visit: Payer: Self-pay | Admitting: Cardiology

## 2024-08-04 ENCOUNTER — Other Ambulatory Visit: Payer: Self-pay | Admitting: Cardiology

## 2024-08-05 LAB — LAB REPORT - SCANNED
A1c: 5.7
Creatinine, POC: 88 mg/dL
EGFR: 108
Microalb Creat Ratio: 11.4
Microalbumin, Urine: 1.01

## 2024-08-10 ENCOUNTER — Ambulatory Visit: Admitting: Cardiology

## 2024-08-11 ENCOUNTER — Other Ambulatory Visit: Payer: Self-pay

## 2024-08-12 ENCOUNTER — Encounter: Payer: Self-pay | Admitting: Cardiology

## 2024-08-12 ENCOUNTER — Ambulatory Visit: Attending: Cardiology | Admitting: Cardiology

## 2024-08-12 VITALS — BP 122/84 | HR 84 | Ht 64.0 in | Wt 160.6 lb

## 2024-08-12 DIAGNOSIS — I4729 Other ventricular tachycardia: Secondary | ICD-10-CM | POA: Diagnosis not present

## 2024-08-12 DIAGNOSIS — E088 Diabetes mellitus due to underlying condition with unspecified complications: Secondary | ICD-10-CM

## 2024-08-12 DIAGNOSIS — E782 Mixed hyperlipidemia: Secondary | ICD-10-CM | POA: Diagnosis not present

## 2024-08-12 NOTE — Progress Notes (Signed)
 Cardiology Office Note:    Date:  08/12/2024   ID:  Cynthia [name], DOB [birth year], MRN [id number]  PCP:  Dyane Anthony RAMAN, FNP  Cardiologist:  Jennifer JONELLE Crape, MD   Referring MD: Dyane Anthony RAMAN, FNP    ASSESSMENT:    1. Nonsustained ventricular tachycardia (HCC)   2. Diabetes mellitus due to underlying condition with unspecified complications (HCC)   3. Mixed dyslipidemia    PLAN:    In order of problems listed above:  Primary prevention stressed with the patient.  Importance of compliance with diet medication stressed and patient verbalized standing. Nonsustained ventricular tachycardia palpitations: These have resolved and she is very happy about it. Mixed dyslipidemia: On lipid-lowering medications ordered by primary care.  Goal LDL less than 60.  Diabetes mellitus Diabetes mellitus: Patient is doing well with diet control and exercise.  She has lost weight and happy about it.  I congratulated her about this.  She will continue her excellent exercise practices. Patient will be seen in follow-up appointment in 6 months or earlier if the patient has any concerns.    Medication Adjustments/Labs and Tests Ordered: Current medicines are reviewed at length with the patient today.  Concerns regarding medicines are outlined above.  Orders Placed This Encounter  Procedures   EKG 12-Lead   No orders of the defined types were placed in this encounter.    No chief complaint on file.    History of Present Illness:    Cynthia [name] is a 48 y.o. female.  Patient has past medical history of mixed dyslipidemia, diabetes mellitus and nonsustained ventricular tachycardia.  She denies any problems at this time and takes care of her activities of daily living.  No chest pain orthopnea or PND.  At the time of my evaluation, the patient is alert awake oriented and in no distress.  Past Medical History:  Diagnosis Date   Allergic rhinitis due to pollen 03/06/2021   Chronic fatigue syndrome  03/06/2021   Chronic pain 03/06/2021   Contact with and (suspected) exposure to other viral communicable diseases 03/06/2021   Diabetes mellitus due to underlying condition with unspecified complications (HCC) 02/17/2020   Diabetes mellitus without complication [facility])    Family history of breast cancer 04/04/2021   Genetic testing 04/17/2021   Negative genetic testing on the CancerNext-Expanded+RNAinsight panel.  The CancerNext-Expanded gene panel offered by [facility] and includes sequencing and rearrangement analysis for the following 77 genes: AIP, ALK, APC*, ATM*, AXIN2, BAP1, BARD1, BLM, BMPR1A, BRCA1*, BRCA2*, BRIP1*, CDC73, CDH1*, CDK4, CDKN1B, CDKN2A, CHEK2*, CTNNA1, DICER1, FANCC, FH, FLCN, GALNT12, KIF1B, LZTR1, MAX, MEN1,   Head trauma    History of gestational diabetes mellitus 03/06/2021   Hyperprolactinemia    Iron  deficiency 03/06/2021   Iron  deficiency anemia 04/13/2021   Nonsustained ventricular tachycardia (HCC) 09/21/2018   Obesity 03/06/2021   Obesity (BMI 30.0-34.9) 04/13/2021   Palpitations 03/03/2018   Pituitary tumor    is now resolved   Plantar fasciitis of left foot 05/25/2021   Prediabetes    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   cabergoline (DOSTINEX) 0.5 MG tablet Take 0.25 mg by mouth 2 (two) times a week.   Calcium -Vitamin D-Vitamin K (VIACTIV CALCIUM  PLUS D) 650-12.5-40 MG-MCG-MCG CHEW Chew 2 each  by mouth daily.   cyanocobalamin  1000 MCG tablet Take 1,000 mcg by mouth daily.   diazepam (VALIUM) 2 MG tablet Take 2 mg by mouth daily as needed (vertigo).   Ferrous Sulfate (IRON ) 325 (65 Fe) MG TABS Take 1 tablet by mouth daily.   fexofenadine (ALLEGRA) 180 MG tablet Take 180 mg by mouth daily.   levonorgestrel-ethinyl estradiol (ALESSE) 0.1-20 MG-MCG tablet Take 1  tablet by mouth daily at 12 noon.   meclizine (ANTIVERT) 25 MG tablet Take 25 mg by mouth 2 (two) times daily as needed for dizziness.   metFORMIN (GLUCOPHAGE-XR) 500 MG 24 hr tablet Take 2,000 mg by mouth daily.   metoprolol  succinate (TOPROL -XL) 50 MG 24 hr tablet Take 1 tablet (50 mg total) by mouth daily.     Allergies:   Azelastine, Montelukast, Amoxicillin-pot clavulanate, Montelukast sodium, Latex, and Penicillins   Social History   Socioeconomic History   Marital status: Married    Spouse name: Not on file   Number of children: Not on file   Years of education: Not on file   Highest education level: Not on file  Occupational History   Not on file  Tobacco Use   Smoking status: Former   Smokeless tobacco: Never  Vaping Use   Vaping status: Never Used  Substance and Sexual Activity   Alcohol use: Not Currently    Comment: very rarely   Drug use: Never   Sexual activity: Not on file  Other Topics Concern   Not on file  Social History Narrative   Not on file   Social Drivers of Health   Financial Resource Strain: Not on file  Food Insecurity: Not on file  Transportation Needs: Not on file  Physical Activity: Not on file  Stress: Not on file  Social Connections: Not on file     Family History: The patient's family history includes Breast cancer (age of onset: 8) in her sister; Cervical cancer (age of onset: 25) in her sister; Dementia in her maternal grandmother; Diabetes in her paternal grandfather; Hypertension in her mother; Lung cancer in her paternal grandfather.  ROS:   Please see the history of present illness.    All other systems reviewed and are negative.  EKGs/Labs/Other Studies Reviewed:    The following studies were reviewed today: I discussed my findings with the patient at length   Recent Labs: 08/18/2023: ALT 10; BUN 12; Creatinine 0.69; Hemoglobin 11.8; Platelet Count 230; Potassium 3.7; Sodium 140  Recent Lipid Panel    Component  Value Date/Time   CHOL 142 05/29/2020 1001   TRIG 89 05/29/2020 1001   HDL 47 05/29/2020 1001   CHOLHDL 3.0 05/29/2020 1001   LDLCALC 78 05/29/2020 1001    Physical Exam:    VS:  BP 122/84   Pulse 84   Ht 5' 4 (1.626 m)   Wt 160 lb 9.6 oz (72.8 kg)   SpO2 96%   BMI 27.57 kg/m     Wt Readings from Last 3 Encounters:  08/12/24 160 lb 9.6 oz (72.8 kg)  02/24/24 170 lb 4.8 oz (77.2 kg)  08/18/23 174 lb 3.2 oz (79 kg)     GEN: Patient is in no acute distress HEENT: Normal NECK: No JVD; No carotid bruits LYMPHATICS: No lymphadenopathy CARDIAC: Hear sounds regular, 2/6 systolic murmur at the apex. RESPIRATORY:  Clear to auscultation without rales, wheezing or rhonchi  ABDOMEN: Soft, non-tender, non-distended MUSCULOSKELETAL:  No edema; No deformity  SKIN: Warm and dry NEUROLOGIC:  Alert and oriented x 3 PSYCHIATRIC:  Normal affect   Signed, Jennifer JONELLE Crape, MD  08/12/2024 11:03 AM    [facility]

## 2024-08-12 NOTE — Patient Instructions (Signed)

## 2024-08-16 ENCOUNTER — Ambulatory Visit: Payer: Self-pay | Admitting: Cardiology

## 2024-08-26 ENCOUNTER — Inpatient Hospital Stay: Admitting: Hematology and Oncology

## 2024-08-26 ENCOUNTER — Inpatient Hospital Stay

## 2024-08-30 ENCOUNTER — Other Ambulatory Visit: Payer: Self-pay | Admitting: Cardiology

## 2024-09-07 ENCOUNTER — Other Ambulatory Visit: Payer: Self-pay

## 2024-09-07 ENCOUNTER — Telehealth: Payer: Self-pay

## 2024-09-07 DIAGNOSIS — D500 Iron deficiency anemia secondary to blood loss (chronic): Secondary | ICD-10-CM

## 2024-09-07 NOTE — Telephone Encounter (Signed)
 Spoke with patient and confirmed appointment on 10/29

## 2024-09-08 ENCOUNTER — Inpatient Hospital Stay (HOSPITAL_BASED_OUTPATIENT_CLINIC_OR_DEPARTMENT_OTHER): Admitting: Hematology and Oncology

## 2024-09-08 ENCOUNTER — Encounter: Payer: Self-pay | Admitting: Hematology and Oncology

## 2024-09-08 ENCOUNTER — Inpatient Hospital Stay: Attending: Hematology and Oncology

## 2024-09-08 VITALS — BP 106/63 | HR 77 | Temp 98.5°F | Resp 16 | Wt 156.8 lb

## 2024-09-08 DIAGNOSIS — E663 Overweight: Secondary | ICD-10-CM | POA: Insufficient documentation

## 2024-09-08 DIAGNOSIS — Z87891 Personal history of nicotine dependence: Secondary | ICD-10-CM | POA: Diagnosis not present

## 2024-09-08 DIAGNOSIS — D500 Iron deficiency anemia secondary to blood loss (chronic): Secondary | ICD-10-CM

## 2024-09-08 DIAGNOSIS — E538 Deficiency of other specified B group vitamins: Secondary | ICD-10-CM | POA: Diagnosis present

## 2024-09-08 DIAGNOSIS — D509 Iron deficiency anemia, unspecified: Secondary | ICD-10-CM | POA: Insufficient documentation

## 2024-09-08 LAB — CBC WITH DIFFERENTIAL (CANCER CENTER ONLY)
Abs Immature Granulocytes: 0.02 K/uL (ref 0.00–0.07)
Basophils Absolute: 0.1 K/uL (ref 0.0–0.1)
Basophils Relative: 1 %
Eosinophils Absolute: 0.1 K/uL (ref 0.0–0.5)
Eosinophils Relative: 1 %
HCT: 39.1 % (ref 36.0–46.0)
Hemoglobin: 12.8 g/dL (ref 12.0–15.0)
Immature Granulocytes: 0 %
Lymphocytes Relative: 40 %
Lymphs Abs: 2.8 K/uL (ref 0.7–4.0)
MCH: 29.8 pg (ref 26.0–34.0)
MCHC: 32.7 g/dL (ref 30.0–36.0)
MCV: 91.1 fL (ref 80.0–100.0)
Monocytes Absolute: 0.4 K/uL (ref 0.1–1.0)
Monocytes Relative: 6 %
Neutro Abs: 3.7 K/uL (ref 1.7–7.7)
Neutrophils Relative %: 52 %
Platelet Count: 266 K/uL (ref 150–400)
RBC: 4.29 MIL/uL (ref 3.87–5.11)
RDW: 12.9 % (ref 11.5–15.5)
WBC Count: 7.1 K/uL (ref 4.0–10.5)
nRBC: 0 % (ref 0.0–0.2)

## 2024-09-08 LAB — CMP (CANCER CENTER ONLY)
ALT: 16 U/L (ref 0–44)
AST: 14 U/L — ABNORMAL LOW (ref 15–41)
Albumin: 4.2 g/dL (ref 3.5–5.0)
Alkaline Phosphatase: 54 U/L (ref 38–126)
Anion gap: 7 (ref 5–15)
BUN: 14 mg/dL (ref 6–20)
CO2: 26 mmol/L (ref 22–32)
Calcium: 9.3 mg/dL (ref 8.9–10.3)
Chloride: 106 mmol/L (ref 98–111)
Creatinine: 0.73 mg/dL (ref 0.44–1.00)
GFR, Estimated: >60 mL/min (ref >60)
Glucose, Bld: 93 mg/dL (ref 70–99)
Potassium: 4.1 mmol/L (ref 3.5–5.1)
Sodium: 139 mmol/L (ref 135–145)
Total Bilirubin: 1 mg/dL (ref 0.0–1.2)
Total Protein: 7.2 g/dL (ref 6.5–8.1)

## 2024-09-08 LAB — IRON AND IRON BINDING CAPACITY (CC-WL,HP ONLY)
Iron: 106 ug/dL (ref 28–170)
Saturation Ratios: 22 % (ref 10.4–31.8)
TIBC: 479 ug/dL — ABNORMAL HIGH (ref 250–450)
UIBC: 373 ug/dL (ref 148–442)

## 2024-09-08 LAB — FERRITIN: Ferritin: 64 ng/mL (ref 11–307)

## 2024-09-08 NOTE — Progress Notes (Signed)
 [facility] CONSULT NOTE  Patient Care Team: Dyane Anthony RAMAN, FNP as PCP - General (Family Medicine)  CHIEF COMPLAINTS/PURPOSE OF CONSULTATION:  IDA, possible need for IV iron  infusion  ASSESSMENT & PLAN:   Assessment and Plan Assessment & Plan Iron  deficiency anemia Anemia improving with hemoglobin increase from 11.8 to 12.8. Oral iron  supplementation effective. - Await iron  panel and ferritin. - Advise to contact if symptoms of low iron  occur.  Vitamin B12 deficiency B12 supplementation adjusted to twice weekly with stable condition. - Add Vitamin B12 level to current lab tests.  Overweight Weight management through dietary changes showing progress. - Encourage continuation of current dietary program.   HISTORY OF PRESENTING ILLNESS:   Cynthia [name] 48 y.o. female is here because of IDA  HPI  This is a very pleasant 48 year old female patient with past medical history significant for prediabetes, prolactinoma, chronic fatigue referred to hematology for evaluation and recommendations for iron  deficiency.  Discussed the use of AI scribe software for clinical note transcription with the patient, who gave verbal consent to proceed.  History of Present Illness  Cynthia [name] is a 48 year old female who presents for follow-up of her iron  levels and overall health status.  She feels well with no significant changes since her last visit. Her hemoglobin level has improved to 12.8 from 11.8. She has not been taking any additional iron  supplements or medications since her last visit. No symptoms of ice craving or extreme fatigue, which she experienced when her iron  was low in the past.  She is not experiencing any difficulty swallowing, restless legs at night, or other new health issues. Her B12 supplementation was adjusted to twice a week, and she has not started any new medications. She continues to menstruate monthly without any significant changes.  She mentions that  she has not had a mammogram in a few years and plans to get one this year. She has been focusing on her diet, following a program at work that emphasizes healthier food choices, which has contributed to her weight loss.  No swelling, blood in stool, or blood in urine. She reports good energy levels and no new health issues.   MEDICAL HISTORY:  Past Medical History:  Diagnosis Date   Allergic rhinitis due to pollen 03/06/2021   Chronic fatigue syndrome 03/06/2021   Chronic pain 03/06/2021   Contact with and (suspected) exposure to other viral communicable diseases 03/06/2021   Diabetes mellitus due to underlying condition with unspecified complications (HCC) 02/17/2020   Diabetes mellitus without complication (HCC)    Family history of breast cancer 04/04/2021   Genetic testing 04/17/2021   Negative genetic testing on the CancerNext-Expanded+RNAinsight panel.  The CancerNext-Expanded gene panel offered by [facility] and includes sequencing and rearrangement analysis for the following 77 genes: AIP, ALK, APC*, ATM*, AXIN2, BAP1, BARD1, BLM, BMPR1A, BRCA1*, BRCA2*, BRIP1*, CDC73, CDH1*, CDK4, CDKN1B, CDKN2A, CHEK2*, CTNNA1, DICER1, FANCC, FH, FLCN, GALNT12, KIF1B, LZTR1, MAX, MEN1,   Head trauma    History of gestational diabetes mellitus 03/06/2021   Hyperprolactinemia    Iron  deficiency 03/06/2021   Iron  deficiency anemia 04/13/2021   Nonsustained ventricular tachycardia (HCC) 09/21/2018   Obesity 03/06/2021   Obesity (BMI 30.0-34.9) 04/13/2021   Palpitations 03/03/2018   Pituitary tumor    is now resolved   Plantar fasciitis of left foot 05/25/2021   Prediabetes    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    SURGICAL HISTORY: Past Surgical History:  Procedure Laterality Date  CESAREAN SECTION      SOCIAL HISTORY: Social History   Socioeconomic History   Marital status: Married    Spouse name: Not on file   Number of children: Not on file   Years of education: Not on file   Highest education  level: Not on file  Occupational History   Not on file  Tobacco Use   Smoking status: Former   Smokeless tobacco: Never  Vaping Use   Vaping status: Never Used  Substance and Sexual Activity   Alcohol use: Not Currently    Comment: very rarely   Drug use: Never   Sexual activity: Not on file  Other Topics Concern   Not on file  Social History Narrative   Not on file   Social Drivers of Health   Financial Resource Strain: Not on file  Food Insecurity: Not on file  Transportation Needs: Not on file  Physical Activity: Not on file  Stress: Not on file  Social Connections: Not on file  Intimate Partner Violence: Not on file    FAMILY HISTORY: Family History  Problem Relation Age of Onset   Hypertension Mother    Cervical cancer Sister 14   Breast cancer Sister 84   Diabetes Paternal Grandfather    Lung cancer Paternal Grandfather    Dementia Maternal Grandmother     ALLERGIES:  is allergic to azelastine, montelukast, amoxicillin-pot clavulanate, montelukast sodium, latex, and penicillins.  MEDICATIONS:  Current Outpatient Medications  Medication Sig Dispense Refill   atorvastatin  (LIPITOR) 10 MG tablet Take 1 tablet (10 mg total) by mouth daily. 90 tablet 3   cabergoline (DOSTINEX) 0.5 MG tablet Take 0.25 mg by mouth 2 (two) times a week.     Calcium -Vitamin D-Vitamin K (VIACTIV CALCIUM  PLUS D) 650-12.5-40 MG-MCG-MCG CHEW Chew 2 each by mouth daily.     cyanocobalamin  1000 MCG tablet Take 1,000 mcg by mouth daily.     diazepam (VALIUM) 2 MG tablet Take 2 mg by mouth daily as needed (vertigo).     Ferrous Sulfate (IRON ) 325 (65 Fe) MG TABS Take 1 tablet by mouth daily.     fexofenadine (ALLEGRA) 180 MG tablet Take 180 mg by mouth daily.     levonorgestrel-ethinyl estradiol (ALESSE) 0.1-20 MG-MCG tablet Take 1 tablet by mouth daily at 12 noon.     meclizine (ANTIVERT) 25 MG tablet Take 25 mg by mouth 2 (two) times daily as needed for dizziness.     metFORMIN  (GLUCOPHAGE-XR) 500 MG 24 hr tablet Take 2,000 mg by mouth daily.     metoprolol  succinate (TOPROL -XL) 50 MG 24 hr tablet Take 1 tablet (50 mg total) by mouth daily. 90 tablet 3   No current facility-administered medications for this visit.    PHYSICAL EXAMINATION:  ECOG PERFORMANCE STATUS: 1 - Symptomatic but completely ambulatory  Vitals:   09/08/24 1157  BP: 106/63  Pulse: 77  Resp: 16  Temp: 98.5 F (36.9 C)  SpO2: 96%    Filed Weights   09/08/24 1157  Weight: 156 lb 12.8 oz (71.1 kg)   Physical Exam Constitutional:      Appearance: Normal appearance.  Cardiovascular:     Rate and Rhythm: Normal rate and regular rhythm.     Pulses: Normal pulses.     Heart sounds: Normal heart sounds.  Pulmonary:     Effort: Pulmonary effort is normal.     Breath sounds: Normal breath sounds.  Abdominal:     General: Abdomen is flat. Bowel  sounds are normal.     Palpations: Abdomen is soft.  Musculoskeletal:        General: Normal range of motion.     Cervical back: Normal range of motion and neck supple. No rigidity.  Lymphadenopathy:     Cervical: No cervical adenopathy.  Skin:    General: Skin is warm and dry.  Neurological:     General: No focal deficit present.     Mental Status: She is alert.  Psychiatric:        Mood and Affect: Mood normal.      LABORATORY DATA:  I have reviewed the data as listed Lab Results  Component Value Date   WBC 7.9 08/18/2023   HGB 11.8 (L) 08/18/2023   HCT 35.0 (L) 08/18/2023   MCV 91.4 08/18/2023   PLT 230 08/18/2023     Chemistry      Component Value Date/Time   NA 140 08/18/2023 1109   NA 141 04/11/2020 0932   K 3.7 08/18/2023 1109   CL 107 08/18/2023 1109   CO2 26 08/18/2023 1109   BUN 12 08/18/2023 1109   BUN 14 04/11/2020 0932   CREATININE 0.69 08/18/2023 1109      Component Value Date/Time   CALCIUM  8.8 (L) 08/18/2023 1109   ALKPHOS 58 08/18/2023 1109   AST 11 (L) 08/18/2023 1109   ALT 10 08/18/2023 1109    BILITOT 0.7 08/18/2023 1109       RADIOGRAPHIC STUDIES: I have personally reviewed the radiological images as listed and agreed with the findings in the report. No results found.  All questions were answered. The patient knows to call the clinic with any problems, questions or concerns. I spent 20 minutes in the care of this patient including H and P, review of records, counseling and coordination of care.     Amber Stalls, MD 09/08/2024 11:58 AM

## 2025-09-09 ENCOUNTER — Inpatient Hospital Stay

## 2025-09-09 ENCOUNTER — Inpatient Hospital Stay: Admitting: Hematology and Oncology
